# Patient Record
Sex: Male | Born: 1965 | Race: White | Hispanic: No | Marital: Married | Smoking: Never smoker
Health system: Southern US, Community
[De-identification: ages and names within clinical notes are randomized; demographics above are authoritative.]

## PROBLEM LIST (undated history)

## (undated) HISTORY — PX: ROTATOR CUFF REPAIR: SHX139

---

## 2007-02-24 ENCOUNTER — Emergency Department: Payer: Self-pay | Admitting: Emergency Medicine

## 2008-08-04 ENCOUNTER — Ambulatory Visit (HOSPITAL_BASED_OUTPATIENT_CLINIC_OR_DEPARTMENT_OTHER): Admission: RE | Admit: 2008-08-04 | Discharge: 2008-08-04 | Payer: Self-pay | Admitting: Orthopedic Surgery

## 2008-12-09 ENCOUNTER — Ambulatory Visit (HOSPITAL_BASED_OUTPATIENT_CLINIC_OR_DEPARTMENT_OTHER): Admission: RE | Admit: 2008-12-09 | Discharge: 2008-12-09 | Payer: Self-pay | Admitting: Orthopedic Surgery

## 2010-05-22 LAB — POCT HEMOGLOBIN-HEMACUE: Hemoglobin: 14.7 g/dL (ref 13.0–17.0)

## 2010-06-27 NOTE — Op Note (Signed)
NAME:  LAW, CORSINO NO.:  [id number]   MEDICAL RECORD NO.:  [id number]          PATIENT TYPE:  AMB   LOCATION:  DSC                          FACILITY:  [facility]   PHYSICIAN:  Loreta Ave, M.D. DATE OF BIRTH:  26-Jun-1965   DATE OF PROCEDURE:  08/04/2008  DATE OF DISCHARGE:                               OPERATIVE REPORT   PREOPERATIVE DIAGNOSIS:  Right shoulder impingement.  Labral tear.  Spinoglenoid cyst.   POSTOPERATIVE DIAGNOSIS:  Right shoulder impingement.  Labral tear.  Spinoglenoid cyst.   PROCEDURE:  Right shoulder exam under anesthesia, arthroscopy,  debridement of labrum.  Arthroscopic excision of the spinoglenoid cyst.  Bursectomy, acromioplasty, rotator cuff debridement.  Coracoacromial  ligament release.  Excision of distal clavicle.   SURGEON:  Loreta Ave, MD   ASSISTANT:  Eulas Post, MD and Genene Churn. Barry Dienes, [place]   ANESTHESIA:  General.   BLOOD LOSS:  Minimal.   SPECIMENS:  None.   CULTURES:  None.   COMPLICATIONS:  None.   DRESSING:  Soft compressive with sling.   PROCEDURE:  The patient was brought to the operating room and placed on  the operating table in supine position.  After adequate anesthesia had  been obtained, shoulder examined.  Full motion, stable shoulder.  Placed  in a beach-chair position on the shoulder positioner and prepped and  draped in the usual sterile fashion.  Three initial portals, anterior,  posterior, and lateral.  Shoulder entered with blunt obturator.  Arthroscope introduced into the shoulder distended and inspected.  Tearing of anterior and posterior labrum debrided.  No significant  paralabral cyst in the front or back.  Biceps tendon and anchor intact,  a little hypermobility, but not a slap lesion.  Articular cartilage,  capsular ligamentous structures, undersurface of rotator cuff excellent.  I then used blunt dissection above the glenoid into the spinoglenoid  notch.  A fourth portal  was added superiorly for debridement.  The cyst  was accessed evident and excised in its entirety with a nonaggressive  shaver.  Taken down to the level of the suprascapular nerve below this.  We just rubbing a blunt instrument over the nerve usually at the muscle  to fiber.  It was obviously protected throughout.  Once I had good  confirmation of excision of the cyst, cannula redirected subacromially.  Type 3 acromion, reactive bursitis, a lot of abrasion on top of the  cuff, no full-thickness tears.  Bursa resected.  Cuff debrided.  Acromioplasty to a type 1 acromion with shaver and high-speed bur,  releasing CA ligament with cautery.  Distal clavicle exposed.  Grade 3  changes of spurs.  Periarticular spurs and lateral centimeter of  clavicle resected.  Adequacy of decompression confirmed viewing from all portals.  Instruments and fluid removed.  Portals closed with nylon.  Sterile  compressive dressing applied.  Sling applied.  Anesthesia reversed.  Brought to the recovery room.  Tolerated surgery well.  No  complications.      Loreta Ave, M.D.  Electronically Signed     DFM/MEDQ  D:  08/04/2008  T:  08/05/2008  Job:  [id number]

## 2017-06-08 ENCOUNTER — Other Ambulatory Visit: Payer: Self-pay | Admitting: Hematology & Oncology

## 2017-06-08 MED ORDER — DOXYCYCLINE HYCLATE 100 MG PO TABS: 100.0000 mg | ORAL_TABLET | Freq: Two times a day (BID) | ORAL | 0 refills | Status: DC

## 2019-04-07 ENCOUNTER — Encounter (HOSPITAL_COMMUNITY): Payer: Self-pay

## 2019-04-07 ENCOUNTER — Emergency Department (HOSPITAL_COMMUNITY): Payer: 59

## 2019-04-07 ENCOUNTER — Emergency Department (HOSPITAL_COMMUNITY)
Admission: EM | Admit: 2019-04-07 | Discharge: 2019-04-08 | Disposition: A | Discharge status: Home / Self Care | Payer: 59 | Attending: Emergency Medicine | Admitting: Emergency Medicine

## 2019-04-07 ENCOUNTER — Other Ambulatory Visit: Payer: Self-pay

## 2019-04-07 DIAGNOSIS — W231XXA Caught, crushed, jammed, or pinched between stationary objects, initial encounter: Secondary | ICD-10-CM | POA: Diagnosis not present

## 2019-04-07 DIAGNOSIS — S67194A Crushing injury of right ring finger, initial encounter: Secondary | ICD-10-CM | POA: Insufficient documentation

## 2019-04-07 DIAGNOSIS — Z23 Encounter for immunization: Secondary | ICD-10-CM | POA: Insufficient documentation

## 2019-04-07 DIAGNOSIS — S61214A Laceration without foreign body of right ring finger without damage to nail, initial encounter: Secondary | ICD-10-CM | POA: Insufficient documentation

## 2019-04-07 DIAGNOSIS — R030 Elevated blood-pressure reading, without diagnosis of hypertension: Secondary | ICD-10-CM | POA: Insufficient documentation

## 2019-04-07 DIAGNOSIS — S67190A Crushing injury of right index finger, initial encounter: Secondary | ICD-10-CM | POA: Insufficient documentation

## 2019-04-07 DIAGNOSIS — Y999 Unspecified external cause status: Secondary | ICD-10-CM | POA: Insufficient documentation

## 2019-04-07 DIAGNOSIS — Y9389 Activity, other specified: Secondary | ICD-10-CM | POA: Insufficient documentation

## 2019-04-07 DIAGNOSIS — Y92321 Football field as the place of occurrence of the external cause: Secondary | ICD-10-CM | POA: Insufficient documentation

## 2019-04-07 DIAGNOSIS — I10 Essential (primary) hypertension: Secondary | ICD-10-CM

## 2019-04-07 DIAGNOSIS — S61210A Laceration without foreign body of right index finger without damage to nail, initial encounter: Secondary | ICD-10-CM | POA: Diagnosis not present

## 2019-04-07 DIAGNOSIS — S6710XA Crushing injury of unspecified finger(s), initial encounter: Secondary | ICD-10-CM

## 2019-04-07 MED ORDER — LIDOCAINE HCL 2 % IJ SOLN
10.0000 mL | Freq: Once | INTRAMUSCULAR | Status: AC
  Administered 2019-04-07: 22:00:00 200 mg via INTRADERMAL
  Filled 2019-04-07: qty 20

## 2019-04-07 MED ORDER — TETANUS-DIPHTH-ACELL PERTUSSIS 5-2.5-18.5 LF-MCG/0.5 IM SUSP
0.5000 mL | Freq: Once | INTRAMUSCULAR | Status: AC
  Administered 2019-04-07: 22:00:00 0.5 mL via INTRAMUSCULAR
  Filled 2019-04-07: qty 0.5

## 2019-04-07 NOTE — ED Triage Notes (Signed)
Pt has a laceration to his right index finger and his middle finger from football practice

## 2019-04-07 NOTE — ED Provider Notes (Signed)
[facility]EMERGENC[facility]PT Provider Note   CSN: [id number] Arrival date & time: 04/07/19  2057     History No chief complaint on file.   Douglas Pena is a 54 y.o. male presents for evaluation of acute onset, persistent laceration to the palmar aspect of the right second digit.  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  He reports that the football players were pushing a seven man sled when it got stuck against a metal fence.  He reports that he was attempting to move the sled against the fence when one of the players accidentally crushed his right hand between the fence and the sled resulting in a laceration.  He denies significant pain around the laceration but does note some pain along the adjacent digit.  Denies numbness or weakness of the extremity.  He is not on any blood thinners.  He reports that it has been several years since he has been evaluated by a PCP.  He is right-hand dominant.  His tetanus is not up-to-date.  The history is provided by the patient.       History reviewed. No pertinent past medical history.  There are no problems to display for this patient.   History reviewed. No pertinent surgical history.     History reviewed. No pertinent family history.  Social History   Tobacco Use  . Smoking status: Never Smoker  . Smokeless tobacco: Never Used  Substance Use Topics  . Alcohol use: Never  . Drug use: Never    Home Medications Prior to Admission medications   Medication Sig Start Date End Date Taking? Authorizing Provider  cephALEXin (KEFLEX) 500 MG capsule Take 1 capsule (500 mg total) by mouth 2 (two) times daily for 5 days. 04/08/19 04/13/19  Michela Pitcher A, PA-C  doxycycline (VIBRA-TABS) 100 MG tablet Take 1 tablet (100 mg total) by mouth 2 (two) times daily. 06/08/17   Josph Macho, MD    Allergies    Patient has no allergy information on record.  Review of Systems   Review of  Systems  Musculoskeletal: Positive for arthralgias.  Skin: Positive for wound.  Neurological: Negative for numbness.  Hematological: Does not bruise/bleed easily.    Physical Exam Updated Vital Signs BP (!) 159/95   Pulse 60   Temp 98.3 F (36.8 C) (Oral)   Resp 18   SpO2 100%   Physical Exam Vitals and nursing note reviewed.  Constitutional:      General: He is not in acute distress.    Appearance: He is well-developed.  HENT:     Head: Normocephalic and atraumatic.  Eyes:     General:        Right eye: No discharge.        Left eye: No discharge.     Conjunctiva/sclera: Conjunctivae normal.  Neck:     Vascular: No JVD.     Trachea: No tracheal deviation.  Cardiovascular:     Rate and Rhythm: Normal rate.     Pulses: Normal pulses.     Comments: 2+ radial pulses bilaterally Pulmonary:     Effort: Pulmonary effort is normal.  Abdominal:     General: There is no distension.  Musculoskeletal:        General: Swelling and tenderness present.     Comments: 4.5 cm semilunar laceration to the palmar aspect of the right second digit, bleeding controlled.  1 cm laceration overlying the right  third PIP joint.  Some swelling noted along the proximal phalanx of the right third digit but no erythema.  He is able to make a fist without difficulty.  5/5 strength of right wrist and digits with flexion and extension against resistance.  Equal grip strength bilaterally.  Skin:    General: Skin is warm and dry.     Findings: No erythema.  Neurological:     Mental Status: He is alert.     Comments: Sensation intact to light touch of bilateral hands.  Psychiatric:        Behavior: Behavior normal.     ED Results / Procedures / Treatments   Labs (all labs ordered are listed, but only abnormal results are displayed) Labs Reviewed - No data to display  EKG None  Radiology DG Hand Complete Right  Result Date: 04/07/2019 CLINICAL DATA:  Laceration, crushing injury, palmar  surface of the base of the index finger, EXAM: RIGHT HAND - COMPLETE 3+ VIEW COMPARISON:  None. FINDINGS: Soft tissue gas noted along the palmar aspect at the base of the second digit at the level of the proximal phalangeal diaphysis. No radiopaque foreign body within the soft tissues. No subjacent osseous injury. No fracture or traumatic malalignment at this location or elsewhere within the hand. Mild arthrosis of the first carpometacarpal and triscaphe joints. No suspicious osseous lesions. IMPRESSION: Soft tissue gas noted along the palmar aspect at the base of the second digit compatible with reported site of laceration. No radiopaque foreign body or subjacent osseous injury. Electronically Signed   By: Lovena Le M.D.   On: 04/07/2019 22:19     Procedures Procedures (including critical care time)  Medications Ordered in ED Medications  Tdap (BOOSTRIX) injection 0.5 mL (0.5 mLs Intramuscular Given 04/07/19 2218)  lidocaine (XYLOCAINE) 2 % (with pres) injection 200 mg (200 mg Intradermal Given by Other 04/07/19 2221)  cephALEXin (KEFLEX) capsule 500 mg (500 mg Oral Given 04/08/19 0020)    ED Course  I have reviewed the triage vital signs and the nursing notes.  Pertinent labs & imaging results that were available during my care of the patient were reviewed by me and considered in my medical decision making (see chart for details).    MDM Rules/Calculators/A&P                      Patient presenting for evaluation after hand injury.  He is afebrile, hypertensive but improved on reevaluation.  Could be secondary to pain.  He has not been seen or evaluated by a PCP in several years so I instructed him to continue monitoring his blood pressures at home and follow-up with a PCP to establish care and for reevaluation.  He is neurovascularly intact, bleeding is controlled.  His tetanus was updated today.  Radiographs show no underlying fracture or retained foreign bodies. Pressure irrigation  performed. Wound explored and base of wound visualized in a bloodless field without evidence of foreign body.  Laceration occurred < 8 hours prior to repair which was well tolerated.  He was discharged with a course of Keflex due to the nature of his injury.  Discussed suture home care with patient and answered questions. Patient to follow-up for wound check and suture removal in 7 days; he is to return to the ED sooner for signs of infection. Patient verbalized understanding of and agreement with plan and is safe for discharge home at this time.   Final Clinical Impression(s) / ED Diagnoses  Final diagnoses:  Crushing injury of finger of right hand  Laceration of right index finger without foreign body without damage to nail, initial encounter  Hypertension, unspecified type    Rx / DC Orders ED Discharge Orders         Ordered    cephALEXin (KEFLEX) 500 MG capsule  2 times daily     04/08/19 0015           Jeanie Sewer, PA-C 04/10/19 1107    Linwood Dibbles, MD 04/11/19 0830

## 2019-04-07 NOTE — ED Notes (Signed)
Pt went to XR

## 2019-04-08 MED ORDER — CEPHALEXIN 500 MG PO CAPS: 500.0000 mg | ORAL_CAPSULE | Freq: Two times a day (BID) | ORAL | 0 refills | Status: AC

## 2019-04-08 MED ORDER — CEPHALEXIN 500 MG PO CAPS
500.0000 mg | ORAL_CAPSULE | Freq: Once | ORAL | Status: AC
  Administered 2019-04-08: 500 mg via ORAL
  Filled 2019-04-08: qty 1

## 2019-04-08 NOTE — Discharge Instructions (Signed)
1. Medications: Please take all of your antibiotics until finished!   Take your antibiotics with food.  Common side effects of antibiotics include nausea, vomiting, abdominal discomfort, and diarrhea. You may help offset some of this with probiotics which you can buy or get in yogurt. Do not eat  or take the probiotics until 2 hours after your antibiotic.    Alternate 600 mg of ibuprofen and [phone] mg of Tylenol every 3-6 hours as needed for pain. Do not exceed 4000 mg of Tylenol daily.  Take ibuprofen with food to avoid upset stomach issues. 2. Treatment: ice for swelling, keep wound clean with warm soap and water and keep bandage dry, do not submerge in water for 24 hours 3. Follow Up: Please return in 7 days to have your stitches/staples removed or sooner if you have concerns.  You may also go to urgent care or your primary care physician.  Return to the emergency department sooner if any concerning signs or symptoms develop such as high fevers, redness, drainage of pus from the wound, or swelling.  If your blood pressure (BP) was elevated on multiple readings during this visit above 130 for the top number or above 80 for the bottom number, please have this repeated by your primary care provider within one month. You can also check your blood pressure when you are out at a pharmacy or grocery store. Many have machines that will check your blood pressure.  If your blood pressure remains elevated, please follow-up with your PCP.   WOUND CARE  Keep area clean and dry for 24 hours. Do not remove bandage, if applied.  After 24 hours, remove bandage and wash wound gently with mild soap and warm water. Reapply a new bandage after cleaning wound, if directed.   Continue daily cleansing with soap and water until stitches/staples are removed.  Do not apply any ointments or creams to the wound while stitches/staples are in place, as this may cause delayed healing. Return if you experience any of the  following signs of infection: Swelling, redness, pus drainage, streaking, fever >101.0 F  Return if you experience excessive bleeding that does not stop after 15-20 minutes of constant, firm pressure.

## 2019-04-15 ENCOUNTER — Encounter: Payer: Self-pay | Admitting: Nurse Practitioner

## 2019-04-15 ENCOUNTER — Other Ambulatory Visit: Payer: Self-pay

## 2019-04-15 ENCOUNTER — Ambulatory Visit (INDEPENDENT_AMBULATORY_CARE_PROVIDER_SITE_OTHER): Payer: 59 | Admitting: Nurse Practitioner

## 2019-04-15 ENCOUNTER — Other Ambulatory Visit: Payer: Self-pay | Admitting: Nurse Practitioner

## 2019-04-15 VITALS — BP 120/80 | HR 63 | Temp 98.0°F | Resp 18 | Ht 72.0 in | Wt 283.4 lb

## 2019-04-15 DIAGNOSIS — Z13228 Encounter for screening for other metabolic disorders: Secondary | ICD-10-CM | POA: Diagnosis not present

## 2019-04-15 DIAGNOSIS — Z125 Encounter for screening for malignant neoplasm of prostate: Secondary | ICD-10-CM

## 2019-04-15 DIAGNOSIS — Z7689 Persons encountering health services in other specified circumstances: Secondary | ICD-10-CM

## 2019-04-15 DIAGNOSIS — Z4802 Encounter for removal of sutures: Secondary | ICD-10-CM | POA: Diagnosis not present

## 2019-04-15 DIAGNOSIS — Z13220 Encounter for screening for lipoid disorders: Secondary | ICD-10-CM

## 2019-04-15 NOTE — Progress Notes (Signed)
New Patient Office Visit  Subjective:  Patient ID: Douglas [facility]ena, male    DOB: September 14, 1965  Age: 54 y.o. MRN: [id number]  CC:  Chief Complaint  Patient presents with  . Establish Care  . Wound Check    suture removal    HPI Douglas Pena is a 54 year old male presenting to the clinic to establish care. Medical history and medications discussed and reviewed. No cp/ct, gu/gisxs, pain, edema, sob, no recent falls. He does need sutures removed. 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 Pt reports left eye bruised [place] and was knocked over.   History reviewed. No pertinent past medical history.  No past surgical history on file.  No family history on file.  Social History   Socioeconomic History  . Marital status: Married    Spouse name: Not on file  . Number of children: Not on file  . Years of education: Not on file  . Highest education level: Not on file  Occupational History  . Not on file  Tobacco Use  . Smoking status: Never Smoker  . Smokeless tobacco: Never Used  Substance and Sexual Activity  . Alcohol use: Never  . Drug use: Never  . Sexual activity: Not on file  Other Topics Concern  . Not on file  Social History Narrative  . Not on file   Social Determinants of Health   Financial Resource Strain:   . Difficulty of Paying Living Expenses: Not on file  Food Insecurity:   . Worried About Programme researcher, broadcasting/film/video in the Last Year: Not on file  . Ran Out of Food in the Last Year: Not on file  Transportation Needs:   . Lack of Transportation (Medical): Not on file  . Lack of Transportation (Non-Medical): Not on file  Physical Activity:   . Days of Exercise per Week: Not on file  . Minutes of Exercise per Session: Not on file  Stress:   . Feeling of Stress : Not on file  Social Connections:   . Frequency of Communication with Friends and Family: Not on file  . Frequency  of Social Gatherings with Friends and Family: Not on file  . Attends Religious Services: Not on file  . Active Member of Clubs or Organizations: Not on file  . Attends Banker Meetings: Not on file  . Marital Status: Not on file  Intimate Partner Violence:   . Fear of Current or Ex-Partner: Not on file  . Emotionally Abused: Not on file  . Physically Abused: Not on file  . Sexually Abused: Not on file    ROS Review of Systems  All other systems reviewed and are negative.   Objective:   Today's Vitals: BP 120/80 (BP Location: Left Arm, Patient Position: Sitting, Cuff Size: Large)   Pulse 63   Temp 98 F (36.7 C) (Oral)   Resp 18   Ht 6' (1.829 m)   Wt 283 lb 6.4 oz (128.5 kg)   SpO2 97%   BMI 38.44 kg/m   Physical Exam Vitals and nursing note reviewed.  Constitutional:      Appearance: Normal appearance. He is not ill-appearing.  HENT:     Head: Normocephalic.     Right Ear: Tympanic membrane, ear canal and external ear normal.     Left Ear: Tympanic membrane, ear canal and external ear normal.  Nose: Nose normal.     Mouth/Throat:     Lips: Pink.     Mouth: Mucous membranes are moist.     Pharynx: Oropharynx is clear.  Eyes:     General: Lids are normal. Lids are everted, no foreign bodies appreciated.     Extraocular Movements: Extraocular movements intact.     Right eye: No nystagmus.     Left eye: No nystagmus.     Conjunctiva/sclera: Conjunctivae normal.     Pupils: Pupils are equal, round, and reactive to light.  Neck:     Thyroid: No thyromegaly.  Cardiovascular:     Rate and Rhythm: Normal rate and regular rhythm.     Pulses: Normal pulses.     Heart sounds: Normal heart sounds, S1 normal and S2 normal.  Pulmonary:     Effort: Pulmonary effort is normal.     Breath sounds: Normal breath sounds.  Musculoskeletal:        General: Normal range of motion.     Cervical back: Normal range of motion and neck supple.     Right lower leg: No  edema.     Left lower leg: No edema.  Lymphadenopathy:     Cervical: No cervical adenopathy.  Skin:    General: Skin is warm.     Capillary Refill: Capillary refill takes less than 2 seconds.     Findings: Wound present. No bruising, erythema or laceration.     Nails: There is no clubbing.          Comments: Suture closed healing old laceration to the palmar aspect of the right second digit, sutures removed, well approximated closed edges. No drainage, no foul odor, no eryhtme or edema.normal temperature.  Healing colored bruising to left eye see HPI  Neurological:     General: No focal deficit present.     Mental Status: He is alert and oriented to person, place, and time.  Psychiatric:        Attention and Perception: Attention normal.        Mood and Affect: Mood normal.        Speech: Speech normal.        Behavior: Behavior normal. Behavior is cooperative.        Thought Content: Thought content normal.        Cognition and Memory: Cognition normal.        Judgment: Judgment normal.     Assessment & Plan:  Established care today with clinic, labs drawn and will call you with abnormal results You should repeat labs in 6 m with apt one week after for annual physical Suture Removal: pt tolerated well, print out provided follow directions  Problem List Items Addressed This Visit    None    Visit Diagnoses    Encounter to establish care    -  Primary   Relevant Orders   COMPLETE METABOLIC PANEL WITH GFR   CBC with Differential/Platelet   Lipid panel   PSA   Visit for suture removal       Lipid screening       Relevant Orders   Lipid panel   Screening for metabolic disorder       Relevant Orders   COMPLETE METABOLIC PANEL WITH GFR   CBC with Differential/Platelet   Screening PSA (prostate specific antigen)       Relevant Orders   PSA      Outpatient Encounter Medications as of 04/15/2019  Medication Sig  . [  DISCONTINUED] doxycycline (VIBRA-TABS) 100 MG tablet  Take 1 tablet (100 mg total) by mouth 2 (two) times daily.   No facility-administered encounter medications on file as of 04/15/2019.    Follow-up:  PRN for signs/symptoms of infection to laceration site; f/u in 6 m for CPE with one wk prior labs.  Elmore Guise, FNP

## 2019-04-16 ENCOUNTER — Other Ambulatory Visit: Payer: Self-pay | Admitting: Nurse Practitioner

## 2019-04-16 DIAGNOSIS — R7309 Other abnormal glucose: Secondary | ICD-10-CM

## 2019-04-16 DIAGNOSIS — E782 Mixed hyperlipidemia: Secondary | ICD-10-CM

## 2019-04-16 NOTE — Progress Notes (Signed)
Patients cholesterol levels are elevated. He should follow low fat and cholesterol diet, exercise at least 20 minutes 4 times a week. Recheck in 6 months. He may take OTC fish oil or omega 3.   Glucose was elevated and I will ask him to come in asap for A1C lab draw (during office hours).

## 2019-04-18 LAB — TEST AUTHORIZATION

## 2019-04-18 LAB — LIPID PANEL
Cholesterol: 213 mg/dL — ABNORMAL HIGH (ref <200)
HDL: 48 mg/dL (ref >=40)
LDL Cholesterol (Calc): 124 mg/dL (calc) — ABNORMAL HIGH
Non-HDL Cholesterol (Calc): 165 mg/dL (calc) — ABNORMAL HIGH (ref <130)
Total CHOL/HDL Ratio: 4.4 (calc) (ref <5.0)
Triglycerides: 266 mg/dL — ABNORMAL HIGH (ref <150)

## 2019-04-18 LAB — COMPLETE METABOLIC PANEL WITH GFR
AG Ratio: 1.8 (calc) (ref 1.0–2.5)
ALT: 21 U/L (ref 9–46)
AST: 21 U/L (ref 10–35)
Albumin: 4.4 g/dL (ref 3.6–5.1)
Alkaline phosphatase (APISO): 67 U/L (ref 35–144)
BUN: 18 mg/dL (ref 7–25)
CO2: 25 mmol/L (ref 20–32)
Calcium: 9.2 mg/dL (ref 8.6–10.3)
Chloride: 103 mmol/L (ref 98–110)
Creat: 0.94 mg/dL (ref 0.70–1.33)
GFR, Est African American: 108 mL/min/{1.73_m2} (ref >=60)
GFR, Est Non African American: 93 mL/min/{1.73_m2} (ref >=60)
Globulin: 2.5 g/dL (calc) (ref 1.9–3.7)
Glucose, Bld: 116 mg/dL — ABNORMAL HIGH (ref 65–99)
Potassium: 4.4 mmol/L (ref 3.5–5.3)
Sodium: 139 mmol/L (ref 135–146)
Total Bilirubin: 0.5 mg/dL (ref 0.2–1.2)
Total Protein: 6.9 g/dL (ref 6.1–8.1)

## 2019-04-18 LAB — PSA: PSA: 0.8 ng/mL (ref <=4.0)

## 2019-04-18 LAB — CBC WITH DIFFERENTIAL/PLATELET
Absolute Monocytes: 451 cells/uL (ref 200–950)
Basophils Absolute: 30 cells/uL (ref 0–200)
Basophils Relative: 0.4 %
Eosinophils Absolute: 163 cells/uL (ref 15–500)
Eosinophils Relative: 2.2 %
HCT: 40.4 % (ref 38.5–50.0)
Hemoglobin: 14.2 g/dL (ref 13.2–17.1)
Lymphs Abs: 1584 cells/uL (ref 850–3900)
MCH: 31.3 pg (ref 27.0–33.0)
MCHC: 35.1 g/dL (ref 32.0–36.0)
MCV: 89 fL (ref 80.0–100.0)
MPV: 11.1 fL (ref 7.5–12.5)
Monocytes Relative: 6.1 %
Neutro Abs: 5173 cells/uL (ref 1500–7800)
Neutrophils Relative %: 69.9 %
Platelets: 261 10*3/uL (ref 140–400)
RBC: 4.54 10*6/uL (ref 4.20–5.80)
RDW: 13.3 % (ref 11.0–15.0)
Total Lymphocyte: 21.4 %
WBC: 7.4 10*3/uL (ref 3.8–10.8)

## 2019-04-18 LAB — HEMOGLOBIN A1C W/OUT EAG: Hgb A1c MFr Bld: 6 % of total Hgb — ABNORMAL HIGH (ref <5.7)

## 2019-06-22 ENCOUNTER — Ambulatory Visit: Payer: 59 | Admitting: Primary Care

## 2019-10-15 ENCOUNTER — Encounter: Payer: 59 | Admitting: Nurse Practitioner

## 2020-02-09 ENCOUNTER — Ambulatory Visit: Payer: BC Managed Care – PPO | Admitting: Psychology

## 2020-08-17 IMAGING — CR DG HAND COMPLETE 3+V*R*
3 series · 3 of 3 positions shown · non-contrast
Comparison: None.

CLINICAL DATA: Laceration, crushing injury, palmar surface of the
base of the index finger,

EXAM:
RIGHT HAND - COMPLETE 3+ VIEW

[x hand pa right]
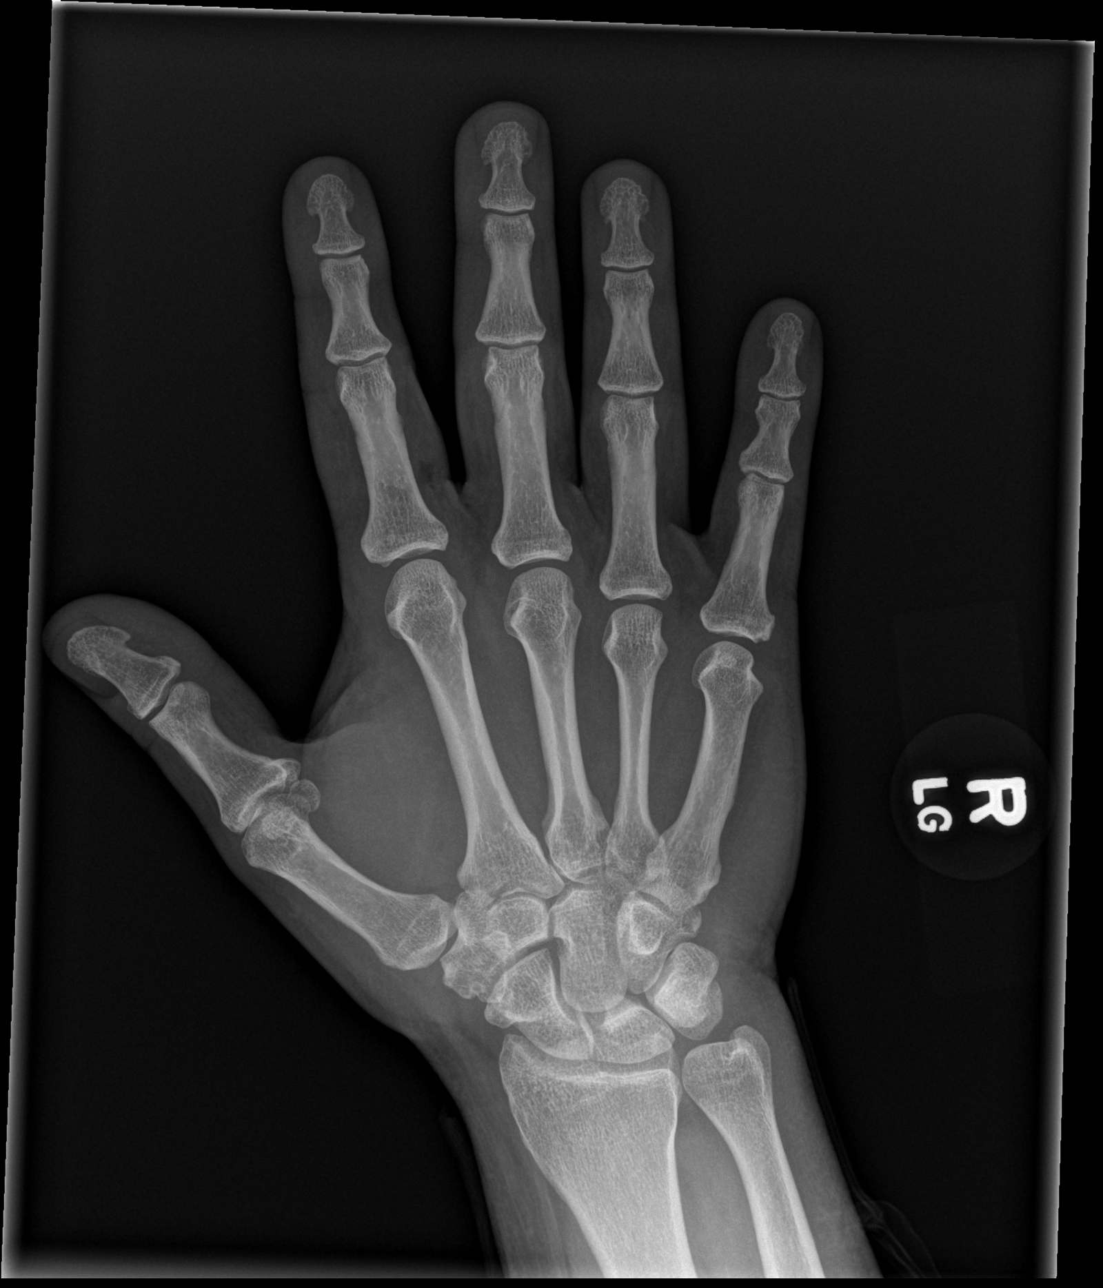

[x hand obl right]
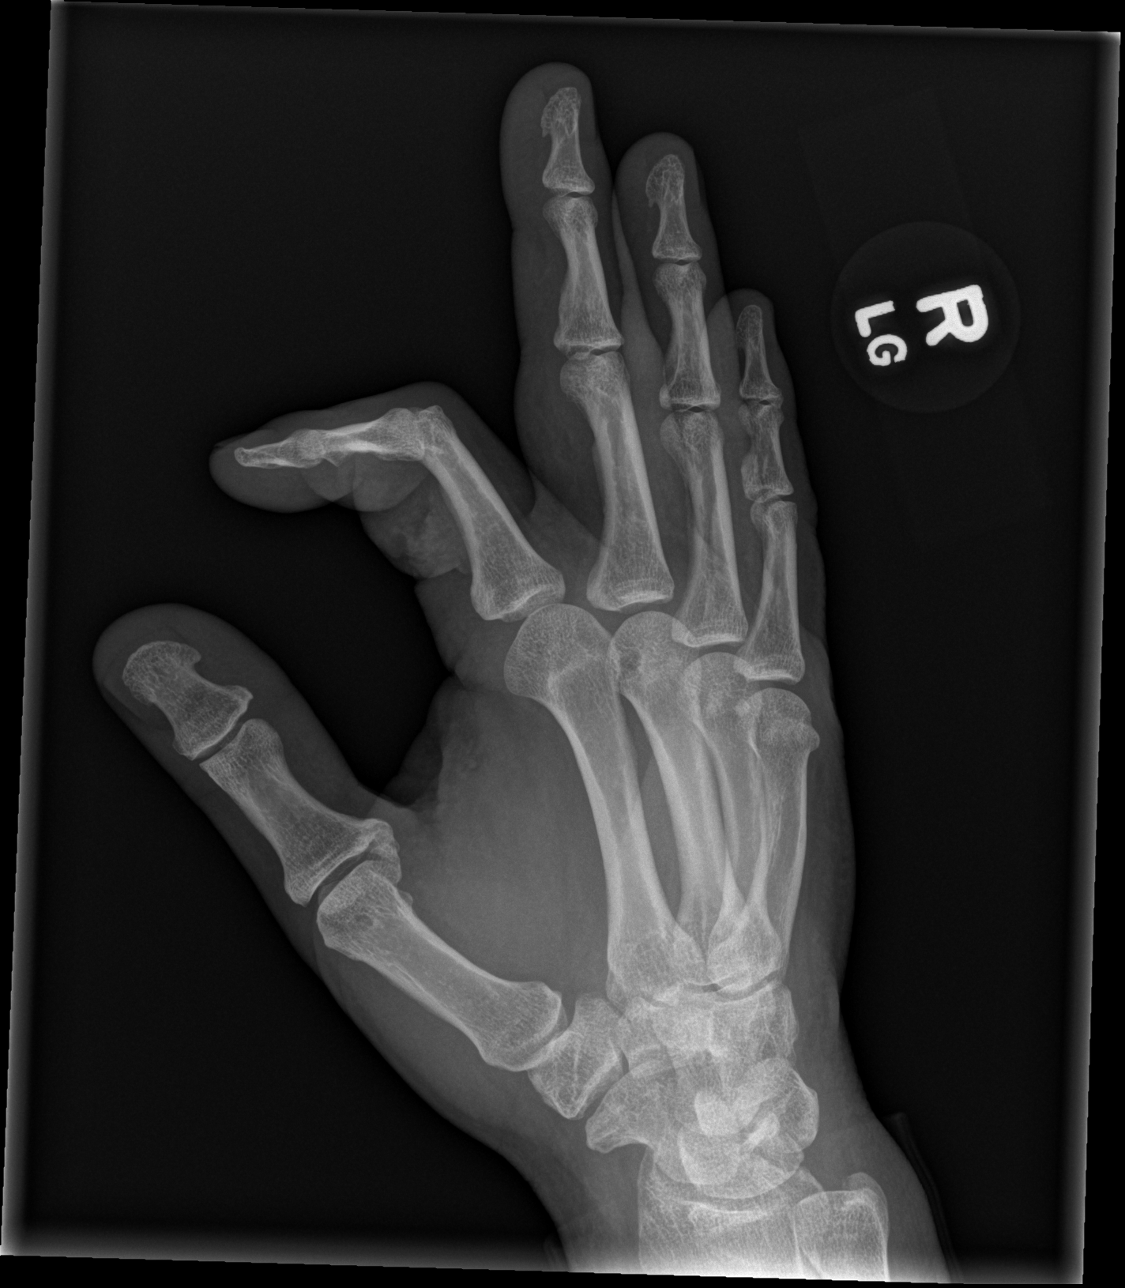

[x hand lat right]
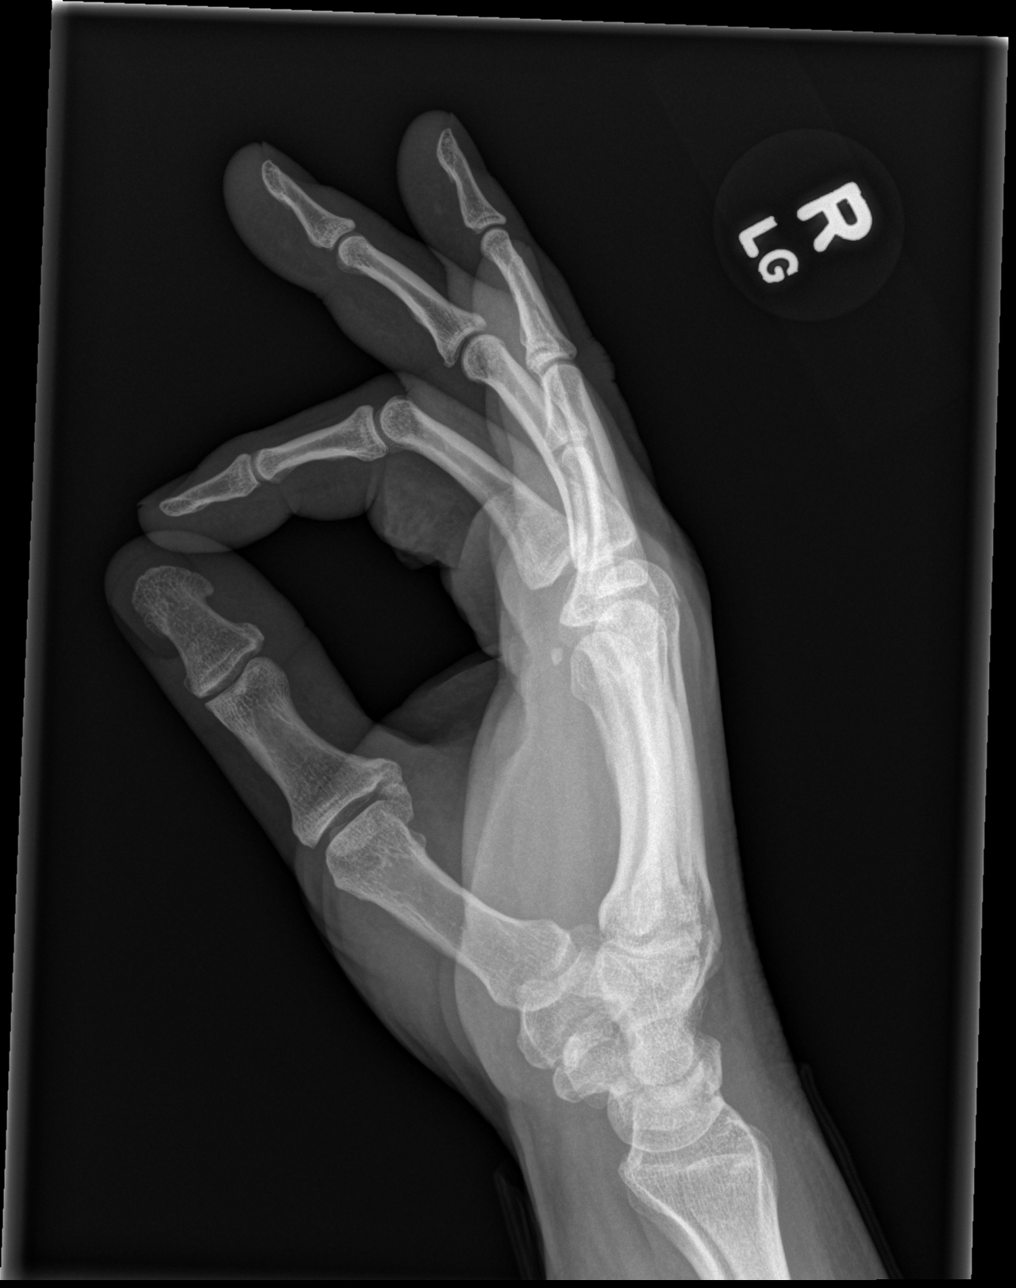

[3 of 3 positions shown; findings below may reference images not displayed]

FINDINGS: Soft tissue gas noted along the palmar aspect at the base of the
second digit at the level of the proximal phalangeal diaphysis. No
radiopaque foreign body within the soft tissues. No subjacent
osseous injury. No fracture or traumatic malalignment at this
location or elsewhere within the hand. Mild arthrosis of the first
carpometacarpal and triscaphe joints. No suspicious osseous lesions.
IMPRESSION: Soft tissue gas noted along the palmar aspect at the base of the
second digit compatible with reported site of laceration.

No radiopaque foreign body or subjacent osseous injury.

## 2021-09-08 ENCOUNTER — Ambulatory Visit
Admission: RE | Admit: 2021-09-08 | Discharge: 2021-09-08 | Disposition: A | Discharge status: Home / Self Care | Payer: Managed Care, Other (non HMO) | Source: Ambulatory Visit

## 2021-09-08 ENCOUNTER — Telehealth: Payer: Self-pay | Admitting: Family Medicine

## 2021-09-08 VITALS — BP 152/90 | HR 66 | Temp 98.1°F | Resp 16

## 2021-09-08 DIAGNOSIS — R030 Elevated blood-pressure reading, without diagnosis of hypertension: Secondary | ICD-10-CM | POA: Diagnosis not present

## 2021-09-08 NOTE — ED Triage Notes (Signed)
Patient c/o Hypertension x 4 days.   Patient denies chest pain, dizziness, headache, SOB, or palpations.   Patient endorses " I was going to work for [employer] and the med tech told me, my systolic pressure was too high".   Patient has never been diagnosed with hypertension.

## 2021-09-08 NOTE — Discharge Instructions (Addendum)
Your blood pressure is elevated today at 160/90; repeat 152/90.  Please have this rechecked by your new primary care provider next week.

## 2021-09-08 NOTE — Telephone Encounter (Signed)
Agree. Thanks

## 2021-09-08 NOTE — ED Provider Notes (Signed)
[facility]    CSN: [id number] Arrival date & time: 09/08/21  1343      History   Chief Complaint Chief Complaint  Patient presents with   Hypertension    HPI Douglas Pena is a 56 y.o. male.  Patient presents with elevated blood pressure during a medical check at work.  He denies dizziness, weakness, numbness, chest pain, shortness of breath, lower extremity edema, or other symptoms.  He does not have a PCP currently.  His medical history includes hyperlipidemia..   The history is provided by the patient.    History reviewed. No pertinent past medical history.  There are no problems to display for this patient.   Past Surgical History:  Procedure Laterality Date   ROTATOR CUFF REPAIR Bilateral        Home Medications    Prior to Admission medications   Not on File    Family History History reviewed. No pertinent family history.  Social History Social History   Tobacco Use   Smoking status: Never   Smokeless tobacco: Never  Vaping Use   Vaping Use: Never used  Substance Use Topics   Alcohol use: Yes    Comment: "occasionally"   Drug use: Never     Allergies   Patient has no known allergies.   Review of Systems Review of Systems   Physical Exam Triage Vital Signs ED Triage Vitals  Enc Vitals Group     BP      Pulse      Resp      Temp      Temp src      SpO2      Weight      Height      Head Circumference      Peak Flow      Pain Score      Pain Loc      Pain Edu?      Excl. in GC?    No data found.  Updated Vital Signs BP (!) 152/90 (BP Location: Right Arm)   Pulse 66   Temp 98.1 F (36.7 C) (Oral)   Resp 16   SpO2 97%   Visual Acuity Right Eye Distance:   Left Eye Distance:   Bilateral Distance:    Right Eye Near:   Left Eye Near:    Bilateral Near:     Physical Exam   UC Treatments / Results  Labs (all labs ordered are listed, but only abnormal results are displayed) Labs Reviewed - No data  to display  EKG   Radiology No results found.  Procedures Procedures (including critical care time)  Medications Ordered in UC Medications - No data to display  Initial Impression / Assessment and Plan / UC Course  I have reviewed the triage vital signs and the nursing notes.  Pertinent labs & imaging results that were available during my care of the patient were reviewed by me and considered in my medical decision making (see chart for details).  Elevated blood pressure reading.  Blood pressure elevated today.  Discussed managing and preventing hypertension, including DASH diet.  Written education provided on preventing hypertension.  Patient does not currently have a PCP.  His cholesterol was elevated when last checked in 2021.  UC nurse assisted patient in scheduling an appointment to establish a PCP next week.  Patient agrees to plan of care.   Final Clinical Impressions(s) / UC Diagnoses   Final diagnoses:  Elevated blood pressure  reading     Discharge Instructions      Your blood pressure is elevated today at 160/90; repeat 152/90.  Please have this rechecked by your new primary care provider next week.          ED Prescriptions   None    PDMP not reviewed this encounter.   Mickie Bail, NP 09/08/21 [phone]

## 2021-09-08 NOTE — Telephone Encounter (Signed)
Patient called and stated that he went for a job and he had to do a medical examination. Patient found out that his blood pressure readings was 180/130. Advise him to go to urgent care or emergency room to get the blood pressure taking care of before he get a new patient appointment. FYI chief doctor Para [facility].

## 2021-09-13 ENCOUNTER — Encounter: Payer: Self-pay | Admitting: Family Medicine

## 2021-09-13 ENCOUNTER — Ambulatory Visit (INDEPENDENT_AMBULATORY_CARE_PROVIDER_SITE_OTHER): Payer: Managed Care, Other (non HMO) | Admitting: Family Medicine

## 2021-09-13 VITALS — BP 160/98 | HR 63 | Temp 97.9°F | Ht 72.0 in | Wt 282.4 lb

## 2021-09-13 DIAGNOSIS — I10 Essential (primary) hypertension: Secondary | ICD-10-CM

## 2021-09-13 DIAGNOSIS — R7303 Prediabetes: Secondary | ICD-10-CM | POA: Diagnosis not present

## 2021-09-13 DIAGNOSIS — Z23 Encounter for immunization: Secondary | ICD-10-CM | POA: Diagnosis not present

## 2021-09-13 DIAGNOSIS — Z1211 Encounter for screening for malignant neoplasm of colon: Secondary | ICD-10-CM | POA: Diagnosis not present

## 2021-09-13 LAB — TSH: TSH: 5.56 u[IU]/mL — ABNORMAL HIGH (ref 0.35–5.50)

## 2021-09-13 LAB — URINALYSIS, ROUTINE W REFLEX MICROSCOPIC
Bilirubin Urine: NEGATIVE
Hgb urine dipstick: NEGATIVE
Ketones, ur: NEGATIVE
Leukocytes,Ua: NEGATIVE
Nitrite: NEGATIVE
RBC / HPF: NONE SEEN (ref 0–?)
Specific Gravity, Urine: 1.01 (ref 1.000–1.030)
Total Protein, Urine: NEGATIVE
Urine Glucose: NEGATIVE
Urobilinogen, UA: 0.2 (ref 0.0–1.0)
WBC, UA: NONE SEEN (ref 0–?)
pH: 7 (ref 5.0–8.0)

## 2021-09-13 LAB — LIPID PANEL
Cholesterol: 216 mg/dL — ABNORMAL HIGH (ref 0–200)
HDL: 45.9 mg/dL (ref >39.00)
NonHDL: 169.66
Total CHOL/HDL Ratio: 5
Triglycerides: 209 mg/dL — ABNORMAL HIGH (ref 0.0–149.0)
VLDL: 41.8 mg/dL — ABNORMAL HIGH (ref 0.0–40.0)

## 2021-09-13 LAB — HEMOGLOBIN A1C: Hgb A1c MFr Bld: 6.3 % (ref 4.6–6.5)

## 2021-09-13 LAB — MICROALBUMIN / CREATININE URINE RATIO
Creatinine,U: 75.4 mg/dL
Microalb Creat Ratio: 0.9 mg/g (ref 0.0–30.0)
Microalb, Ur: <0.7 mg/dL (ref 0.0–1.9)

## 2021-09-13 LAB — COMPREHENSIVE METABOLIC PANEL
ALT: 28 U/L (ref 0–53)
AST: 22 U/L (ref 0–37)
Albumin: 4.8 g/dL (ref 3.5–5.2)
Alkaline Phosphatase: 64 U/L (ref 39–117)
BUN: 16 mg/dL (ref 6–23)
CO2: 26 mEq/L (ref 19–32)
Calcium: 9.9 mg/dL (ref 8.4–10.5)
Chloride: 102 mEq/L (ref 96–112)
Creatinine, Ser: 1.14 mg/dL (ref 0.40–1.50)
GFR: 73.02 mL/min (ref >60.00)
Glucose, Bld: 128 mg/dL — ABNORMAL HIGH (ref 70–99)
Potassium: 4.9 mEq/L (ref 3.5–5.1)
Sodium: 138 mEq/L (ref 135–145)
Total Bilirubin: 0.6 mg/dL (ref 0.2–1.2)
Total Protein: 7.8 g/dL (ref 6.0–8.3)

## 2021-09-13 LAB — CBC WITH DIFFERENTIAL/PLATELET
Basophils Absolute: 0 10*3/uL (ref 0.0–0.1)
Basophils Relative: 0.5 % (ref 0.0–3.0)
Eosinophils Absolute: 0.2 10*3/uL (ref 0.0–0.7)
Eosinophils Relative: 2.5 % (ref 0.0–5.0)
HCT: 44.3 % (ref 39.0–52.0)
Hemoglobin: 15 g/dL (ref 13.0–17.0)
Lymphocytes Relative: 29.6 % (ref 12.0–46.0)
Lymphs Abs: 1.8 10*3/uL (ref 0.7–4.0)
MCHC: 33.8 g/dL (ref 30.0–36.0)
MCV: 93.4 fl (ref 78.0–100.0)
Monocytes Absolute: 0.6 10*3/uL (ref 0.1–1.0)
Monocytes Relative: 9.7 % (ref 3.0–12.0)
Neutro Abs: 3.5 10*3/uL (ref 1.4–7.7)
Neutrophils Relative %: 57.7 % (ref 43.0–77.0)
Platelets: 240 10*3/uL (ref 150.0–400.0)
RBC: 4.74 Mil/uL (ref 4.22–5.81)
RDW: 13.5 % (ref 11.5–15.5)
WBC: 6.1 10*3/uL (ref 4.0–10.5)

## 2021-09-13 LAB — LDL CHOLESTEROL, DIRECT: Direct LDL: 138 mg/dL

## 2021-09-13 MED ORDER — AMLODIPINE BESYLATE 5 MG PO TABS: 5.0000 mg | ORAL_TABLET | Freq: Every day | ORAL | 1 refills | Status: DC

## 2021-09-13 NOTE — Progress Notes (Signed)
New Patient Office Visit  Subjective:  Patient ID: Douglas Pena, male    DOB: 1965-07-31  Age: 56 y.o. MRN: [id number]  CC:  Chief Complaint  Patient presents with   Establish Care    Need new pcp HTN Fasting     HPI Douglas Pena presents for new pt.    Elevated bp found at medical check at work for [employer].  Sent to UC on 09/08/21.  Asymptomatic.  Bp 160/90.  No FH HTN.   CAD-pgpa-50's pgma-79.  Exercises-wts 3d/wk and other days.  Got large cuff last wk 128/84 yesterday.  Stress last pm and 149/97.  Going to work on diet  History reviewed. No pertinent past medical history.  Past Surgical History:  Procedure Laterality Date   ROTATOR CUFF REPAIR Bilateral     Family History  Problem Relation Age of Onset   Depression Paternal Grandmother    Heart attack Paternal Grandmother    Heart attack Paternal Grandfather     Social History   Socioeconomic History   Marital status: Married    Spouse name: Not on file   Number of children: 2   Years of education: Not on file   Highest education level: Not on file  Occupational History   Not on file  Tobacco Use   Smoking status: Never   Smokeless tobacco: Never  Vaping Use   Vaping Use: Never used  Substance and Sexual Activity   Alcohol use: Yes    Alcohol/week: 4.0 standard drinks of alcohol    Types: 2 Glasses of wine, 2 Cans of beer per week    Comment: "occasionally"   Drug use: Never   Sexual activity: Yes  Other Topics Concern   Not on file  Social History Narrative   Real estate agent   Social Determinants of Health   Financial Resource Strain: Not on file  Food Insecurity: Not on file  Transportation Needs: Not on file  Physical Activity: Not on file  Stress: Not on file  Social Connections: Not on file  Intimate Partner Violence: Not on file    ROS  ROS: Gen: no fever, chills  Skin: no rash, itching ENT: no ear pain, ear drainage, nasal congestion, rhinorrhea, sinus pressure, sore  throat Eyes: no blurry vision, double vision Resp: no cough, wheeze,SOB CV: no CP, palpitations, LE edema,  GI: no heartburn, n/v/d/c, abd pain GU: no dysuria, urgency, frequency, hematuria. No ED MSK: no joint pain, myalgias, back pain Neuro: no dizziness, headache, weakness, vertigo Psych: no depression, anxiety, insomnia, SI   Objective:   Today's Vitals: BP (!) 160/98   Pulse 63   Temp 97.9 F (36.6 C) (Temporal)   Ht 6' (1.829 m)   Wt 282 lb 6 oz (128.1 kg)   SpO2 95%   BMI 38.30 kg/m   Physical Exam  Gen: WDWN NAD OWM HEENT: NCAT, conjunctiva not injected, sclera nonicteric TM WNL hearing aids NECK:  supple, no thyromegaly, no nodes, no carotid bruits CARDIAC: RRR, S1S2+, no murmur. DP 2+B LUNGS: CTAB. No wheezes ABDOMEN:  BS+, soft, NTND, No HSM, no masses EXT:  no edema MSK: no gross abnormalities.  NEURO: A&O x3.  CN II-XII intact.  PSYCH: normal mood. Good eye contact   Vitiligo all over  EKG-RSR III,V1.  NSR.  No ST changes-prob normal  Reviewed labs in chart-2021 A1C 6, trigs 266, LDL 124, HDL 48  Assessment & Plan:   Problem List Items Addressed This Visit  None Visit Diagnoses     Primary hypertension    -  Primary   Relevant Medications   amLODipine (NORVASC) 5 MG tablet   Other Relevant Orders   Comprehensive metabolic panel   Hemoglobin A1c   TSH   Lipid panel   CBC with Differential/Platelet   Microalbumin / creatinine urine ratio   Urinalysis, Routine w reflex microscopic   EKG 12-Lead   Prediabetes       Relevant Orders   Hemoglobin A1c   Screen for colon cancer       Relevant Orders   Cologuard      HTN-new dx.  Work on diet/exercise.  Check cbc,cmp,ua, urine microalb/creat, lipids, tsh, A1C   start amlodipine 5mg . EKG.  Monitor bp's.  F/u 1 mo.   Predm-pt unaware.  Work on diet/exercise.  Check A1C  Outpatient Encounter Medications as of 09/13/2021  Medication Sig   amLODipine (NORVASC) 5 MG tablet Take 1 tablet (5 mg total)  by mouth daily.   No facility-administered encounter medications on file as of 09/13/2021.    Follow-up: Return in about 4 weeks (around 10/11/2021) for htn.   10/13/2021, MD

## 2021-09-13 NOTE — Patient Instructions (Signed)
Welcome to [facility] at [place]! It was a pleasure meeting you today.  As discussed, Please schedule a 1 month follow up visit today.  PLEASE NOTE:  If you had any LAB tests please let us know if you have not heard back within a few days. You may see your results on MyChart before we have a chance to review them but we will give you a call once they are reviewed by us. If we ordered any REFERRALS today, please let us know if you have not heard from their office within the next week.  Let us know through MyChart if you are needing REFILLS, or have your pharmacy send us the request. You can also use MyChart to communicate with me or any office staff.  Please try these tips to maintain a healthy lifestyle:  Eat most of your calories during the day when you are active. Eliminate processed foods including packaged sweets (pies, cakes, cookies), reduce intake of potatoes, white bread, white pasta, and white rice. Look for whole grain options, oat flour or almond flour.  Each meal should contain half fruits/vegetables, one quarter protein, and one quarter carbs (no bigger than a computer mouse).  Cut down on sweet beverages. This includes juice, soda, and sweet tea. Also watch fruit intake, though this is a healthier sweet option, it still contains natural sugar! Limit to 3 servings daily.  Drink at least 1 glass of water with each meal and aim for at least 8 glasses per day  Exercise at least 150 minutes every week.   

## 2021-09-27 LAB — COLOGUARD: Cologuard: NEGATIVE

## 2021-09-28 LAB — COLOGUARD
COLOGUARD: NEGATIVE
Cologuard: NEGATIVE

## 2021-10-02 ENCOUNTER — Encounter: Payer: Self-pay | Admitting: Family Medicine

## 2021-10-13 ENCOUNTER — Ambulatory Visit: Payer: Managed Care, Other (non HMO) | Admitting: Family Medicine

## 2021-10-18 ENCOUNTER — Ambulatory Visit (INDEPENDENT_AMBULATORY_CARE_PROVIDER_SITE_OTHER): Payer: 59 | Admitting: Family Medicine

## 2021-10-18 ENCOUNTER — Encounter: Payer: Self-pay | Admitting: Family Medicine

## 2021-10-18 VITALS — BP 134/80 | HR 72 | Temp 98.1°F | Ht 72.0 in | Wt 277.2 lb

## 2021-10-18 DIAGNOSIS — I10 Essential (primary) hypertension: Secondary | ICD-10-CM

## 2021-10-18 MED ORDER — AMLODIPINE BESYLATE 10 MG PO TABS: 10.0000 mg | ORAL_TABLET | Freq: Every day | ORAL | 1 refills | Status: DC

## 2021-10-18 NOTE — Progress Notes (Signed)
   Subjective:     Patient ID: Douglas Pena, male    DOB: Jan 20, 1966, 56 y.o.   MRN: [id number]  Chief Complaint  Patient presents with   Follow-up    4 week follow-up on HTN, took last pill this morning Pt stated he did have some swelling in ankles, but they went down      HPI  HTN-Pt is on amlodipine 5mg   Bp's running 134-150/80's.  No ha/dizziness/cp/palp/edema/cough/sob/ED  Health Maintenance Due  Topic Date Due   COLONOSCOPY (Pts 45-91yrs Insurance coverage will need to be confirmed)  Never done    History reviewed. No pertinent past medical history.  Past Surgical History:  Procedure Laterality Date   ROTATOR CUFF REPAIR Bilateral     Outpatient Medications Prior to Visit  Medication Sig Dispense Refill   amLODipine (NORVASC) 5 MG tablet Take 1 tablet (5 mg total) by mouth daily. 30 tablet 1   No facility-administered medications prior to visit.    No Known Allergies ROS neg/noncontributory except as noted HPI/below      Objective:     BP 134/80 (BP Location: Left Arm, Patient Position: Sitting, Cuff Size: Large)   Pulse 72   Temp 98.1 F (36.7 C) (Temporal)   Ht 6' (1.829 m)   Wt 277 lb 4 oz (125.8 kg)   SpO2 97%   BMI 37.60 kg/m  Wt Readings from Last 3 Encounters:  10/18/21 277 lb 4 oz (125.8 kg)  09/13/21 282 lb 6 oz (128.1 kg)  04/15/19 283 lb 6.4 oz (128.5 kg)    Physical Exam   Gen: WDWN NAD HEENT: NCAT, conjunctiva not injected, sclera nonicteric CARDIAC: RRR, S1S2+, no murmur. LUNGS: CTAB. No wheezes EXT:  no edema MSK: no gross abnormalities.  NEURO: A&O x3.  CN II-XII intact.  PSYCH: normal mood. Good eye contact vitiligo     Assessment & Plan:   Problem List Items Addressed This Visit       Cardiovascular and Mediastinum   Primary hypertension - Primary   Relevant Medications   amLODipine (NORVASC) 10 MG tablet   HTN-chronic.  Not ideal.  Increase amlodipine to 10mg   monitor bp's  message in 2-3 wks w/readings and  any Side effects.    F/u 67mo.  Continue diet/exercise  Meds ordered this encounter  Medications   amLODipine (NORVASC) 10 MG tablet    Sig: Take 1 tablet (10 mg total) by mouth daily.    Dispense:  90 tablet    Refill:  1    , MD

## 2021-10-18 NOTE — Patient Instructions (Signed)
It was very nice to see you today!  Increase amlodipine to 10mg .  Message in 2-3 weeks w/progress.     PLEASE NOTE:  If you had any lab tests please let know if you have not heard back within a few days. You may see your results on MyChart before we have a chance to review them but we will give you a call once they are reviewed by [place]. If we ordered any referrals today, please let us know if you have not heard from their office within the next week.   Please try these tips to maintain a healthy lifestyle:  Eat most of your calories during the day when you are active. Eliminate processed foods including packaged sweets (pies, cakes, cookies), reduce intake of potatoes, white bread, white pasta, and white rice. Look for whole grain options, oat flour or almond flour.  Each meal should contain half fruits/vegetables, one quarter protein, and one quarter carbs (no bigger than a computer mouse).  Cut down on sweet beverages. This includes juice, soda, and sweet tea. Also watch fruit intake, though this is a healthier sweet option, it still contains natural sugar! Limit to 3 servings daily.  Drink at least 1 glass of water with each meal and aim for at least 8 glasses per day  Exercise at least 150 minutes every week.

## 2021-12-07 ENCOUNTER — Encounter: Payer: Self-pay | Admitting: Family Medicine

## 2022-01-16 ENCOUNTER — Ambulatory Visit (LOCAL_COMMUNITY_HEALTH_CENTER): Payer: 59

## 2022-01-16 DIAGNOSIS — Z111 Encounter for screening for respiratory tuberculosis: Secondary | ICD-10-CM

## 2022-01-19 ENCOUNTER — Ambulatory Visit (LOCAL_COMMUNITY_HEALTH_CENTER): Payer: 59

## 2022-01-19 DIAGNOSIS — Z111 Encounter for screening for respiratory tuberculosis: Secondary | ICD-10-CM

## 2022-01-19 LAB — TB SKIN TEST
Induration: 0 mm
TB Skin Test: NEGATIVE

## 2022-01-25 ENCOUNTER — Encounter: Payer: Self-pay | Admitting: *Deleted

## 2022-03-15 ENCOUNTER — Ambulatory Visit (INDEPENDENT_AMBULATORY_CARE_PROVIDER_SITE_OTHER): Payer: 59

## 2022-03-15 DIAGNOSIS — Z23 Encounter for immunization: Secondary | ICD-10-CM | POA: Diagnosis not present

## 2022-03-15 NOTE — Progress Notes (Signed)
Pt here for 2nd dose shingles vaccine for Dr. Cherlynn Kaiser. Injection given in left deltoid. Pt tolerated well.

## 2022-06-04 ENCOUNTER — Other Ambulatory Visit: Payer: Self-pay | Admitting: Family Medicine

## 2022-06-05 NOTE — Telephone Encounter (Signed)
Attempted to contact pt but no answer , lvm to cb

## 2022-07-15 ENCOUNTER — Encounter: Payer: Self-pay | Admitting: Family Medicine

## 2022-07-16 ENCOUNTER — Other Ambulatory Visit: Payer: Self-pay

## 2022-07-16 MED ORDER — TELMISARTAN 40 MG PO TABS: 40.0000 mg | ORAL_TABLET | Freq: Two times a day (BID) | ORAL | 0 refills | Status: DC

## 2022-07-16 MED ORDER — TELMISARTAN 40 MG PO TABS: 40.0000 mg | ORAL_TABLET | Freq: Every day | ORAL | 0 refills | Status: DC

## 2022-08-08 ENCOUNTER — Encounter: Payer: Self-pay | Admitting: Family Medicine

## 2022-08-08 ENCOUNTER — Ambulatory Visit: Payer: BC Managed Care – PPO | Admitting: Family Medicine

## 2022-08-08 VITALS — BP 148/90 | HR 65 | Temp 97.5°F | Resp 18 | Ht 72.0 in | Wt 277.2 lb

## 2022-08-08 DIAGNOSIS — R7303 Prediabetes: Secondary | ICD-10-CM

## 2022-08-08 DIAGNOSIS — R946 Abnormal results of thyroid function studies: Secondary | ICD-10-CM

## 2022-08-08 DIAGNOSIS — I10 Essential (primary) hypertension: Secondary | ICD-10-CM | POA: Diagnosis not present

## 2022-08-08 LAB — COMPREHENSIVE METABOLIC PANEL
ALT: 26 U/L (ref 0–53)
AST: 26 U/L (ref 0–37)
Albumin: 4.6 g/dL (ref 3.5–5.2)
Alkaline Phosphatase: 70 U/L (ref 39–117)
BUN: 18 mg/dL (ref 6–23)
CO2: 25 mEq/L (ref 19–32)
Calcium: 9.9 mg/dL (ref 8.4–10.5)
Chloride: 103 mEq/L (ref 96–112)
Creatinine, Ser: 1.16 mg/dL (ref 0.40–1.50)
GFR: 71.06 mL/min (ref >60.00)
Glucose, Bld: 156 mg/dL — ABNORMAL HIGH (ref 70–99)
Potassium: 4.7 mEq/L (ref 3.5–5.1)
Sodium: 136 mEq/L (ref 135–145)
Total Bilirubin: 0.6 mg/dL (ref 0.2–1.2)
Total Protein: 7.6 g/dL (ref 6.0–8.3)

## 2022-08-08 LAB — CBC WITH DIFFERENTIAL/PLATELET
Basophils Absolute: 0 10*3/uL (ref 0.0–0.1)
Basophils Relative: 0.4 % (ref 0.0–3.0)
Eosinophils Absolute: 0.1 10*3/uL (ref 0.0–0.7)
Eosinophils Relative: 2.3 % (ref 0.0–5.0)
HCT: 43.6 % (ref 39.0–52.0)
Hemoglobin: 14.6 g/dL (ref 13.0–17.0)
Lymphocytes Relative: 30.5 % (ref 12.0–46.0)
Lymphs Abs: 1.8 10*3/uL (ref 0.7–4.0)
MCHC: 33.4 g/dL (ref 30.0–36.0)
MCV: 91.3 fl (ref 78.0–100.0)
Monocytes Absolute: 0.5 10*3/uL (ref 0.1–1.0)
Monocytes Relative: 8.5 % (ref 3.0–12.0)
Neutro Abs: 3.5 10*3/uL (ref 1.4–7.7)
Neutrophils Relative %: 58.3 % (ref 43.0–77.0)
Platelets: 252 10*3/uL (ref 150.0–400.0)
RBC: 4.78 Mil/uL (ref 4.22–5.81)
RDW: 13.6 % (ref 11.5–15.5)
WBC: 6.1 10*3/uL (ref 4.0–10.5)

## 2022-08-08 LAB — HEMOGLOBIN A1C: Hgb A1c MFr Bld: 6.7 % — ABNORMAL HIGH (ref 4.6–6.5)

## 2022-08-08 LAB — LIPID PANEL
Cholesterol: 203 mg/dL — ABNORMAL HIGH (ref 0–200)
HDL: 43.1 mg/dL (ref >39.00)
LDL Cholesterol: 132 mg/dL — ABNORMAL HIGH (ref 0–99)
NonHDL: 160.12
Total CHOL/HDL Ratio: 5
Triglycerides: 139 mg/dL (ref 0.0–149.0)
VLDL: 27.8 mg/dL (ref 0.0–40.0)

## 2022-08-08 LAB — TSH: TSH: 4.52 u[IU]/mL (ref 0.35–5.50)

## 2022-08-08 LAB — T4, FREE: Free T4: 0.75 ng/dL (ref 0.60–1.60)

## 2022-08-08 MED ORDER — TELMISARTAN 80 MG PO TABS: 80.0000 mg | ORAL_TABLET | Freq: Every day | ORAL | 1 refills | Status: DC

## 2022-08-08 NOTE — Assessment & Plan Note (Signed)
Chronic.  Not controlled.  Increase micardis to 80mg .  Monitor bps.  Work on diet/exercise

## 2022-08-08 NOTE — Assessment & Plan Note (Signed)
Chronic.  Controlled.  Work on diet/exercise 

## 2022-08-08 NOTE — Progress Notes (Signed)
   Subjective:     Patient ID: Douglas Pena, male    DOB: January 18, 1966, 57 y.o.   MRN: [id number]  Chief Complaint  Patient presents with   Medical Management of Chronic Issues    2 week follow-up on HTN     HPI  HTN-Pt is on micardis 40mg  as amlodipine caused edema so stopped in May and bp's went up.  Bp's running upper 130's/upper 80's.  No ha/dizziness/cp/palp/edema/cough/sob   eats a lot of potassium foods. No ED.  There are no preventive care reminders to display for this patient.  History reviewed. No pertinent past medical history.  Past Surgical History:  Procedure Laterality Date   ROTATOR CUFF REPAIR Bilateral      Current Outpatient Medications:    telmisartan (MICARDIS) 80 MG tablet, Take 1 tablet (80 mg total) by mouth daily., Disp: 90 tablet, Rfl: 1  No Known Allergies ROS neg/noncontributory except as noted HPI/below      Objective:     BP (!) 148/90 (BP Location: Left Arm, Patient Position: Sitting, Cuff Size: Large)   Pulse 65   Temp (!) 97.5 F (36.4 C) (Temporal)   Resp 18   Ht 6' (1.829 m)   Wt 277 lb 4 oz (125.8 kg)   SpO2 98%   BMI 37.60 kg/m  Wt Readings from Last 3 Encounters:  08/08/22 277 lb 4 oz (125.8 kg)  10/18/21 277 lb 4 oz (125.8 kg)  09/13/21 282 lb 6 oz (128.1 kg)    Physical Exam   Gen: WDWN NAD HEENT: NCAT, conjunctiva not injected, sclera nonicteric NECK:  supple, no thyromegaly, no nodes, no carotid bruits CARDIAC: RRR, S1S2+, no murmur. DP 2+B LUNGS: CTAB. No wheezes ABDOMEN:  BS+, soft, NTND, No HSM, no masses EXT:  no edema MSK: no gross abnormalities.  NEURO: A&O x3.  CN II-XII intact.  PSYCH: normal mood. Good eye contact vitiligo     Assessment & Plan:  Primary hypertension Assessment & Plan: Chronic.  Not controlled.  Increase micardis to 80mg .  Monitor bps.  Work on diet/exercise  Orders: -     Comprehensive metabolic panel -     Lipid panel -     Telmisartan; Take 1 tablet (80 mg total) by mouth  daily.  Dispense: 90 tablet; Refill: 1 -     CBC with Differential/Platelet  Prediabetes Assessment & Plan: Chronic.  Controlled.  Work on diet/exercise  Orders: -     Comprehensive metabolic panel -     Lipid panel -     Hemoglobin A1c  Abnormal thyroid function test -     TSH -     T4, free    Return in about 6 months (around 02/07/2023) for HTN.  Angelena Sole, MD

## 2022-08-08 NOTE — Patient Instructions (Signed)
It was very nice to see you today!  Increase telmisartan to 80mg .  Monitor blood pressures   PLEASE NOTE:  If you had any lab tests please let us know if you have not heard back within a few days. You may see your results on MyChart before we have a chance to review them but we will give you a call once they are reviewed by [place]. If we ordered any referrals today, please let us know if you have not heard from their office within the next week.   Please try these tips to maintain a healthy lifestyle:  Eat most of your calories during the day when you are active. Eliminate processed foods including packaged sweets (pies, cakes, cookies), reduce intake of potatoes, white bread, white pasta, and white rice. Look for whole grain options, oat flour or almond flour.  Each meal should contain half fruits/vegetables, one quarter protein, and one quarter carbs (no bigger than a computer mouse).  Cut down on sweet beverages. This includes juice, soda, and sweet tea. Also watch fruit intake, though this is a healthier sweet option, it still contains natural sugar! Limit to 3 servings daily.  Drink at least 1 glass of water with each meal and aim for at least 8 glasses per day  Exercise at least 150 minutes every week.

## 2022-08-09 NOTE — Progress Notes (Signed)
Cholesterol a little elevated.   Sugars-technically diabetic now!.  Does he want to do a virtual visit to discuss options or work on diet/exercise and see me in 3 months to reck?

## 2023-01-16 ENCOUNTER — Encounter: Payer: Self-pay | Admitting: Family Medicine

## 2023-01-18 ENCOUNTER — Ambulatory Visit: Payer: BC Managed Care – PPO | Admitting: Family Medicine

## 2023-01-18 VITALS — BP 160/88 | HR 61 | Temp 97.8°F | Resp 18 | Ht 72.0 in | Wt 284.0 lb

## 2023-01-18 DIAGNOSIS — R0609 Other forms of dyspnea: Secondary | ICD-10-CM

## 2023-01-18 DIAGNOSIS — I10 Essential (primary) hypertension: Secondary | ICD-10-CM | POA: Diagnosis not present

## 2023-01-18 DIAGNOSIS — R0789 Other chest pain: Secondary | ICD-10-CM | POA: Diagnosis not present

## 2023-01-18 MED ORDER — NITROGLYCERIN 0.4 MG SL SUBL: 0.4000 mg | SUBLINGUAL_TABLET | SUBLINGUAL | 0 refills | Status: AC | PRN

## 2023-01-18 MED ORDER — HYDROCHLOROTHIAZIDE 25 MG PO TABS: 25.0000 mg | ORAL_TABLET | Freq: Every day | ORAL | 1 refills | Status: DC

## 2023-01-18 MED ORDER — HYDROCHLOROTHIAZIDE 25 MG PO TABS: 25.0000 mg | ORAL_TABLET | Freq: Every day | ORAL | 3 refills | Status: DC

## 2023-01-18 NOTE — Patient Instructions (Signed)

## 2023-01-18 NOTE — Progress Notes (Signed)
Subjective:     Patient ID: Douglas Pena, male    DOB: Sep 22, 1965, 57 y.o.   MRN: [id number]  Chief Complaint  Patient presents with   Issues while walking long distance    Had 2 episodes on Tuesday night, felt really out of breath and fatigued while walking long distances at a hockey game, was fine when sitting down, has not happened since Tuesday    HPI On 12/3. On 11/30-walked same hill and fine Was walking to and from stadium at hockey game and had DOE and fatigue and chest tightness on top of hill and on way back down again.  No rad/no sweats/dizziness,etc..  it was really cold out as well.  No h/o asthma. There is a hill but this time worst. .  Resolved w/rest.  No/ha/dizziness. No recurrance Bp's have been mid 130's/80's.  Since episode, still normal activity->10K steps/day-no problems-hill yesterday and fine.   Stress test sev yrs ago and cp, etc when really cold out.   FH CAD pat gma 70's and gpa-93 m gpa in 30's(DM as well).  Health Maintenance Due  Topic Date Due   HIV Screening  Never done   Hepatitis C Screening  Never done    History reviewed. No pertinent past medical history.  Past Surgical History:  Procedure Laterality Date   ROTATOR CUFF REPAIR Bilateral      Current Outpatient Medications:    nitroGLYCERIN (NITROSTAT) 0.4 MG SL tablet, Place 1 tablet (0.4 mg total) under the tongue every 5 (five) minutes as needed for chest pain., Disp: 50 tablet, Rfl: 0   telmisartan (MICARDIS) 80 MG tablet, Take 1 tablet (80 mg total) by mouth daily., Disp: 90 tablet, Rfl: 1   hydrochlorothiazide (HYDRODIURIL) 25 MG tablet, Take 1 tablet (25 mg total) by mouth daily., Disp: 30 tablet, Rfl: 1  No Known Allergies ROS neg/noncontributory except as noted HPI/below      Objective:     BP (!) 160/88 (BP Location: Left Arm, Patient Position: Sitting, Cuff Size: Large)   Pulse 61   Temp 97.8 F (36.6 C) (Temporal)   Resp 18   Ht 6' (1.829 m)   Wt 284 lb (128.8 kg)    SpO2 95%   BMI 38.52 kg/m  Wt Readings from Last 3 Encounters:  01/18/23 284 lb (128.8 kg)  08/08/22 277 lb 4 oz (125.8 kg)  10/18/21 277 lb 4 oz (125.8 kg)    Physical Exam   Gen: WDWN NAD HEENT: NCAT, conjunctiva not injected, sclera nonicteric NECK:  supple, no thyromegaly, no nodes, no carotid bruits CARDIAC: RRR, S1S2+, no murmur. DP 2+B LUNGS: CTAB. No wheezes ABDOMEN:  BS+, soft, NTND, No HSM, no masses EXT:  no edema MSK: no gross abnormalities.  NEURO: A&O x3.  CN II-XII intact.  PSYCH: normal mood. Good eye contact  EKG NSR. No st changes    Assessment & Plan:  Primary hypertension Assessment & Plan: Chronic.  Not controlled.  But was(last checked 1 mo ago).  Not sure if anxious, out of control now, other.  Will add hydrochlorothiazide 25mg   continue telmisartan 80mg .  Monitor bps.  Work on diet/exercise  Orders: -     EKG 12-Lead -     hydroCHLOROthiazide; Take 1 tablet (25 mg total) by mouth daily.  Dispense: 30 tablet; Refill: 1 -     Ambulatory referral to Cardiology  DOE (dyspnea on exertion) -     EKG 12-Lead -     Ambulatory referral  to Cardiology  Chest tightness -     Ambulatory referral to Cardiology  Other orders -     Nitroglycerin; Place 1 tablet (0.4 mg total) under the tongue every 5 (five) minutes as needed for chest pain.  Dispense: 50 tablet; Refill: 0  DOE/chest tightness-was cold out as well.  Pt has HTN, DM, some FH CAD.  Ntg to have on hand.  Refer Card.  Return for as sch 12/23.  Angelena Sole, MD

## 2023-01-18 NOTE — Assessment & Plan Note (Signed)
Chronic.  Not controlled.  But was(last checked 1 mo ago).  Not sure if anxious, out of control now, other.  Will add hydrochlorothiazide 25mg   continue telmisartan 80mg .  Monitor bps.  Work on diet/exercise

## 2023-01-19 ENCOUNTER — Encounter: Payer: Self-pay | Admitting: Family Medicine

## 2023-01-22 ENCOUNTER — Encounter: Payer: Self-pay | Admitting: Family Medicine

## 2023-02-03 ENCOUNTER — Other Ambulatory Visit: Payer: Self-pay | Admitting: Family Medicine

## 2023-02-03 DIAGNOSIS — I10 Essential (primary) hypertension: Secondary | ICD-10-CM

## 2023-02-04 ENCOUNTER — Ambulatory Visit: Payer: BC Managed Care – PPO | Admitting: Family Medicine

## 2023-02-04 ENCOUNTER — Encounter: Payer: Self-pay | Admitting: Family Medicine

## 2023-02-04 VITALS — BP 130/82 | HR 61 | Temp 97.1°F | Ht 72.0 in | Wt 283.4 lb

## 2023-02-04 DIAGNOSIS — Z125 Encounter for screening for malignant neoplasm of prostate: Secondary | ICD-10-CM

## 2023-02-04 DIAGNOSIS — I10 Essential (primary) hypertension: Secondary | ICD-10-CM

## 2023-02-04 DIAGNOSIS — Z7985 Long-term (current) use of injectable non-insulin antidiabetic drugs: Secondary | ICD-10-CM | POA: Diagnosis not present

## 2023-02-04 DIAGNOSIS — E119 Type 2 diabetes mellitus without complications: Secondary | ICD-10-CM | POA: Insufficient documentation

## 2023-02-04 LAB — LIPID PANEL
Cholesterol: 199 mg/dL (ref 0–200)
HDL: 37.2 mg/dL — ABNORMAL LOW (ref >39.00)
LDL Cholesterol: 99 mg/dL (ref 0–99)
NonHDL: 161.67
Total CHOL/HDL Ratio: 5
Triglycerides: 313 mg/dL — ABNORMAL HIGH (ref 0.0–149.0)
VLDL: 62.6 mg/dL — ABNORMAL HIGH (ref 0.0–40.0)

## 2023-02-04 LAB — COMPREHENSIVE METABOLIC PANEL
ALT: 28 U/L (ref 0–53)
AST: 21 U/L (ref 0–37)
Albumin: 4.2 g/dL (ref 3.5–5.2)
Alkaline Phosphatase: 74 U/L (ref 39–117)
BUN: 16 mg/dL (ref 6–23)
CO2: 24 meq/L (ref 19–32)
Calcium: 9.3 mg/dL (ref 8.4–10.5)
Chloride: 103 meq/L (ref 96–112)
Creatinine, Ser: 1.01 mg/dL (ref 0.40–1.50)
GFR: 83.61 mL/min (ref >60.00)
Glucose, Bld: 155 mg/dL — ABNORMAL HIGH (ref 70–99)
Potassium: 4.3 meq/L (ref 3.5–5.1)
Sodium: 137 meq/L (ref 135–145)
Total Bilirubin: 0.4 mg/dL (ref 0.2–1.2)
Total Protein: 7.2 g/dL (ref 6.0–8.3)

## 2023-02-04 LAB — PSA: PSA: 1.13 ng/mL (ref 0.10–4.00)

## 2023-02-04 LAB — CBC WITH DIFFERENTIAL/PLATELET
Basophils Absolute: 0 10*3/uL (ref 0.0–0.1)
Basophils Relative: 0.5 % (ref 0.0–3.0)
Eosinophils Absolute: 0.2 10*3/uL (ref 0.0–0.7)
Eosinophils Relative: 2.5 % (ref 0.0–5.0)
HCT: 41.9 % (ref 39.0–52.0)
Hemoglobin: 14.2 g/dL (ref 13.0–17.0)
Lymphocytes Relative: 27.4 % (ref 12.0–46.0)
Lymphs Abs: 1.8 10*3/uL (ref 0.7–4.0)
MCHC: 33.9 g/dL (ref 30.0–36.0)
MCV: 91.3 fL (ref 78.0–100.0)
Monocytes Absolute: 0.5 10*3/uL (ref 0.1–1.0)
Monocytes Relative: 7.6 % (ref 3.0–12.0)
Neutro Abs: 4 10*3/uL (ref 1.4–7.7)
Neutrophils Relative %: 62 % (ref 43.0–77.0)
Platelets: 319 10*3/uL (ref 150.0–400.0)
RBC: 4.59 Mil/uL (ref 4.22–5.81)
RDW: 13 % (ref 11.5–15.5)
WBC: 6.4 10*3/uL (ref 4.0–10.5)

## 2023-02-04 LAB — HEMOGLOBIN A1C: Hgb A1c MFr Bld: 7.3 % — ABNORMAL HIGH (ref 4.6–6.5)

## 2023-02-04 LAB — TSH: TSH: 5.25 u[IU]/mL (ref 0.35–5.50)

## 2023-02-04 MED ORDER — HYDROCHLOROTHIAZIDE 25 MG PO TABS: 25.0000 mg | ORAL_TABLET | Freq: Every day | ORAL | 1 refills | Status: DC

## 2023-02-04 MED ORDER — TIRZEPATIDE 2.5 MG/0.5ML ~~LOC~~ SOAJ: 2.5000 mg | SUBCUTANEOUS | 0 refills | Status: AC

## 2023-02-04 NOTE — Patient Instructions (Addendum)
It was very nice to see you today!  Happy holidays  May need colace 100mg  daily and miralax if constipated from mounjaro   PLEASE NOTE:  If you had any lab tests please let us know if you have not heard back within a few days. You may see your results on MyChart before we have a chance to review them but we will give you a call once they are reviewed by [place]. If we ordered any referrals today, please let us know if you have not heard from their office within the next week.   Please try these tips to maintain a healthy lifestyle:  Eat most of your calories during the day when you are active. Eliminate processed foods including packaged sweets (pies, cakes, cookies), reduce intake of potatoes, white bread, white pasta, and white rice. Look for whole grain options, oat flour or almond flour.  Each meal should contain half fruits/vegetables, one quarter protein, and one quarter carbs (no bigger than a computer mouse).  Cut down on sweet beverages. This includes juice, soda, and sweet tea. Also watch fruit intake, though this is a healthier sweet option, it still contains natural sugar! Limit to 3 servings daily.  Drink at least 1 glass of water with each meal and aim for at least 8 glasses per day  Exercise at least 150 minutes every week.

## 2023-02-04 NOTE — Progress Notes (Signed)
Sugars worse and triglycerides are elevated.  He is starting new diet plan and hopefully starting on Mounjaro Will see how he's doing in 3 months at follow-up

## 2023-02-04 NOTE — Assessment & Plan Note (Signed)
Chronic.  At goal 79m ago.  Working on diet/exercise, but still struggles.  Will add mounjaro 2.5mg  weekly and titrate.  SED.  Has htn as well so feel he needs this.

## 2023-02-04 NOTE — Assessment & Plan Note (Signed)
Chronic.  controlled.  Continue  hydrochlorothiazide 25mg   continue telmisartan 80mg .  Monitor bps.  Work on diet/exercise

## 2023-02-04 NOTE — Progress Notes (Signed)
Subjective:     Patient ID: Douglas Pena, male    DOB: [birth year], 57 y.o.   MRN: [id number]  Chief Complaint  Patient presents with   6 month follow-up    Reports that BP readings have been good at home since starting the medication.   Hypertension    HPI  HTN-Pt is on micardis 80mg  and hydrochlorothiazide 25mg   Bp's running120-130/70/80  No ha/dizziness/cp/palp/edema/cough/sob   eats a lot of potassium foods. No ED. Did get edema on amlodipine.   No DOE anymore. DM-working on diet/exercise some.  Starting MIND diet w/wife.   Health Maintenance Due  Topic Date Due   FOOT EXAM  Never done   OPHTHALMOLOGY EXAM  Never done   Diabetic kidney evaluation - Urine ACR  09/14/2022     History reviewed. No pertinent past medical history.  Past Surgical History:  Procedure Laterality Date   ROTATOR CUFF REPAIR Bilateral      Current Outpatient Medications:    nitroGLYCERIN (NITROSTAT) 0.4 MG SL tablet, Place 1 tablet (0.4 mg total) under the tongue every 5 (five) minutes as needed for chest pain., Disp: 50 tablet, Rfl: 0   telmisartan (MICARDIS) 80 MG tablet, TAKE 1 TABLET BY MOUTH EVERY DAY, Disp: 90 tablet, Rfl: 1   tirzepatide (MOUNJARO) 2.5 MG/0.5ML Pen, Inject 2.5 mg into the skin once a week., Disp: 2 mL, Rfl: 0   hydrochlorothiazide (HYDRODIURIL) 25 MG tablet, Take 1 tablet (25 mg total) by mouth daily., Disp: 90 tablet, Rfl: 1  No Known Allergies ROS neg/noncontributory except as noted HPI/below      Objective:     BP 130/82   Pulse 61   Temp (!) 97.1 F (36.2 C) (Temporal)   Ht 6' (1.829 m)   Wt 283 lb 6.4 oz (128.5 kg)   SpO2 96%   BMI 38.44 kg/m  Wt Readings from Last 3 Encounters:  02/04/23 283 lb 6.4 oz (128.5 kg)  01/18/23 284 lb (128.8 kg)  08/08/22 277 lb 4 oz (125.8 kg)    Physical Exam   Gen: WDWN NAD HEENT: NCAT, conjunctiva not injected, sclera nonicteric NECK:  supple, no thyromegaly, no nodes, no carotid bruits CARDIAC: RRR, S1S2+,  no murmur. DP 2+B LUNGS: CTAB. No wheezes ABDOMEN:  BS+, soft, NTND, No HSM, no masses EXT:  no edema MSK: no gross abnormalities.  NEURO: A&O x3.  CN II-XII intact.  PSYCH: normal mood. Good eye contact vitiligo     Assessment & Plan:  Primary hypertension Assessment & Plan: Chronic.  controlled.  Continue  hydrochlorothiazide 25mg   continue telmisartan 80mg .  Monitor bps.  Work on diet/exercise  Orders: -     CBC with Differential/Platelet -     Comprehensive metabolic panel -     hydroCHLOROthiazide; Take 1 tablet (25 mg total) by mouth daily.  Dispense: 90 tablet; Refill: 1  Type 2 diabetes mellitus without complication, without long-term current use of insulin (HCC) Assessment & Plan: Chronic.  At goal 82m ago.  Working on diet/exercise, but still struggles.  Will add mounjaro 2.5mg  weekly and titrate.  SED.  Has htn as well so feel he needs this.   Orders: -     Comprehensive metabolic panel -     TSH -     Lipid panel -     Hemoglobin A1c -     Tirzepatide; Inject 2.5 mg into the skin once a week.  Dispense: 2 mL; Refill: 0  Screening for prostate cancer -  PSA    Return in about 3 months (around 05/05/2023) for DM, HTN.  Angelena Sole, MD

## 2023-03-12 ENCOUNTER — Encounter: Payer: Self-pay | Admitting: Family Medicine

## 2023-03-13 ENCOUNTER — Telehealth: Payer: Self-pay

## 2023-03-13 ENCOUNTER — Other Ambulatory Visit (HOSPITAL_COMMUNITY): Payer: Self-pay

## 2023-03-13 NOTE — Telephone Encounter (Signed)
Pharmacy Patient Advocate Encounter   Received notification from Patient Advice Request messages that prior authorization for [facility] is required/requested.   Insurance verification completed.   The patient is insured through [facility] .   Per test claim: PA required; PA submitted to above mentioned insurance via CoverMyMeds Key/confirmation #/EOC [id number] Status is pending

## 2023-03-13 NOTE — Telephone Encounter (Signed)
Pharmacy Patient Advocate Encounter  Received notification from [facility] that Prior Authorization for [facility] 2.5MG /0.5ML auto-injectors has been CANCELLED due to:Your PA request has been closed. Mounjaro injection E11.9-Type 2 diabetes mellitus without complications There was a claim run at the pharmacy and reversed. test claim paid with date 03/13/23 and 90 days out. The pharmacy is running the claim with the ICD-10, No PA needed at this time.   PA #/Case ID/Reference #: [id number]   Per test claim: The current 28 day co-pay is, $25.  No PA needed at this time. This test claim was processed through [facility]- copay amounts may vary at other pharmacies due to pharmacy/plan contracts, or as the patient moves through the different stages of their insurance plan.

## 2023-03-19 ENCOUNTER — Encounter (HOSPITAL_BASED_OUTPATIENT_CLINIC_OR_DEPARTMENT_OTHER): Payer: Self-pay | Admitting: Cardiology

## 2023-03-19 ENCOUNTER — Ambulatory Visit (HOSPITAL_BASED_OUTPATIENT_CLINIC_OR_DEPARTMENT_OTHER): Payer: 59 | Admitting: Cardiology

## 2023-03-19 ENCOUNTER — Other Ambulatory Visit (HOSPITAL_BASED_OUTPATIENT_CLINIC_OR_DEPARTMENT_OTHER): Payer: Self-pay

## 2023-03-19 VITALS — BP 144/90 | HR 65 | Ht 72.0 in | Wt 291.8 lb

## 2023-03-19 DIAGNOSIS — Z5181 Encounter for therapeutic drug level monitoring: Secondary | ICD-10-CM

## 2023-03-19 DIAGNOSIS — R079 Chest pain, unspecified: Secondary | ICD-10-CM

## 2023-03-19 DIAGNOSIS — I10 Essential (primary) hypertension: Secondary | ICD-10-CM | POA: Diagnosis not present

## 2023-03-19 DIAGNOSIS — E785 Hyperlipidemia, unspecified: Secondary | ICD-10-CM | POA: Diagnosis not present

## 2023-03-19 MED ORDER — ATORVASTATIN CALCIUM 20 MG PO TABS
20.0000 mg | ORAL_TABLET | Freq: Every day | ORAL | 3 refills | Status: DC
  Filled 2023-03-19: qty 90, 90d supply, fill #0

## 2023-03-19 NOTE — Patient Instructions (Addendum)
 Medication Instructions:  START ATORVASTATIN  20 MG DAILY   *If you need a refill on your cardiac medications before your next appointment, please call your pharmacy*  Lab Work: BMET ABOUT A WEEK PRIOR TO CARDIAC CT  FASTING LIPID IN 3 MONTHS   If you have labs (blood work) drawn today and your tests are completely normal, you will receive your results only by: MyChart Message (if you have MyChart) OR A paper copy in the mail If you have any lab test that is abnormal or we need to change your treatment, we will call you to review the results.  Testing/Procedures: Your physician has requested that you have an echocardiogram. Echocardiography is a painless test that uses sound waves to create images of your heart. It provides your doctor with information about the size and shape of your heart and how well your heart's chambers and valves are working. This procedure takes approximately one hour. There are no restrictions for this procedure. Please do NOT wear cologne, perfume, aftershave, or lotions (deodorant is allowed). Please arrive 15 minutes prior to your appointment time.  Please note: We ask at that you not bring children with you during ultrasound (echo/ vascular) testing. Due to room size and safety concerns, children are not allowed in the ultrasound rooms during exams. Our front office staff cannot provide observation of children in our lobby area while testing is being conducted. An adult accompanying a patient to their appointment will only be allowed in the ultrasound room at the discretion of the ultrasound technician under special circumstances. We apologize for any inconvenience.   Your physician has requested that you have cardiac CT. Cardiac computed tomography (CT) is a painless test that uses an x-ray machine to take clear, detailed pictures of your heart. For further information please visit [email or web address] Please follow instruction sheet as given.  Follow-Up: At [facility], you and your health needs are our priority.  As part of our continuing mission to provide you with exceptional heart care, we have created designated Provider Care Teams.  These Care Teams include your primary Cardiologist (physician) and Advanced Practice Providers (APPs -  Physician Assistants and Nurse Practitioners) who all work together to provide you with the care you need, when you need it.  We recommend signing up for the patient portal called MyChart.  Sign up information is provided on this After Visit Summary.  MyChart is used to connect with patients for Virtual Visits (Telemedicine).  Patients are able to view lab/test results, encounter notes, upcoming appointments, etc.  Non-urgent messages can be sent to your provider as well.   To learn more about what you can do with MyChart, go to [email or web address].    Your next appointment:   4 month(s)  Provider:   Lonni Nanas, MD    Other Instructions MONITOR AND LOG YOUR BLOOD PRESSURE TWICE A DAY FOR 1 WEEK. CALL OR SEND MYCHART WITH THE READINGS   Your cardiac CT will be scheduled at one of the below locations:   [facility] [address] [place], [place] [address] [phone]  OR  [facility] [address] [place], [place] [address] [phone]  OR   [facility] [address] [place], [place] [address] [phone]  OR   [facility] [address] [place], [place] [address] [phone]  If scheduled at [facility], please arrive at the [facility] and [facility]'s Entrance (Entrance C2)  of [facility] 30 minutes prior to test start time. You can use the FREE valet parking offered at entrance C (encouraged to control the heart rate for the test)  Proceed to the [facility] Radiology Department (first floor) to check-in and test prep.  All radiology patients and guests  should use entrance C2 at [facility], accessed from [facility], even though the hospital's physical address listed is [address].    If scheduled at [facility] or [facility], please arrive 15 mins early for check-in and test prep.  There is spacious parking and easy access to the radiology department from the [facility] Heart and Vascular entrance. Please enter here and check-in with the desk attendant.   If scheduled at [facility], please arrive 30 minutes early for check-in and test prep.  Please follow these instructions carefully (unless otherwise directed):  An IV will be required for this test and Nitroglycerin  will be given.  Hold all erectile dysfunction medications at least 3 days (72 hrs) prior to test. (Ie viagra, cialis, sildenafil, tadalafil, etc)   On the Night Before the Test: Be sure to Drink plenty of water. Do not consume any caffeinated/decaffeinated beverages or chocolate 12 hours prior to your test. Do not take any antihistamines 12 hours prior to your test.  On the Day of the Test: Drink plenty of water until 1 hour prior to the test. Do not eat any food 1 hour prior to test. You may take your regular medications prior to the test.  Take metoprolol (Lopressor) two hours prior to test. If you take Furosemide/Hydrochlorothiazide /Spironolactone/Chlorthalidone, please HOLD on the morning of the test. Patients who wear a continuous glucose monitor MUST remove the device prior to scanning.  After the Test: Drink plenty of water. After receiving IV contrast, you may experience a mild flushed feeling. This is normal. On occasion, you may experience a mild rash up to 24 hours after the test. This is not dangerous. If this occurs, you can take Benadryl 25 mg, Zyrtec, Claritin, or Allegra and increase your fluid intake. (Patients taking Tikosyn should avoid Benadryl, and may take Zyrtec,  Claritin, or Allegra) If you experience trouble breathing, this can be serious. If it is severe call 911 IMMEDIATELY. If it is mild, please call our office.  We will call to schedule your test 2-4 weeks out understanding that some insurance companies will need an authorization prior to the service being performed.   For more information and frequently asked questions, please visit our website : [email or web address]  For non-scheduling related questions, please contact the cardiac imaging nurse navigator should you have any questions/concerns: Cardiac Imaging Nurse Navigators Direct Office Dial: [phone]   For scheduling needs, including cancellations and rescheduling, please call Brittany, [phone].   Cardiac CT Angiogram A cardiac CT angiogram is a procedure to look at the heart and the area around the heart. It may be done to help find the cause of chest pains or other symptoms of heart disease. During this procedure, a substance called contrast dye is injected into a vein in the arm. The contrast highlights the blood vessels in the area to be checked. A large X-ray machine (CT scanner), then takes detailed pictures of the heart and the surrounding area. The procedure is also sometimes called a coronary CT angiogram, coronary artery scanning, or CTA. A cardiac CT angiogram allows the health care provider to see how well blood is flowing to  and from the heart. The provider will be able to see if there are any problems, such as: Blockage or narrowing of the arteries in the heart. Fluid around the heart. Signs of weakness or disease in the muscles, valves, and tissues of the heart. Tell a health care provider about: Any allergies you have. This is especially important if you have had a previous allergic reaction to medicines, contrast dye, or iodine. All medicines you are taking, including vitamins, herbs, eye drops, creams, and over-the-counter medicines. Any bleeding  problems you have. Any surgeries you have had. Any medical conditions you have, including kidney problems or kidney failure. Whether you are pregnant or may be pregnant. Any anxiety disorders, chronic pain, or other conditions you have. These may increase your stress or prevent you from lying still. Any history of abnormal heart rhythms or heart procedures. What are the risks? Your provider will talk with you about risks. These may include: Bleeding. Infection. Allergic reactions to medicines or dyes. Damage to other structures or organs. Kidney damage from the contrast dye. Increased risk of cancer from radiation exposure. This risk is low. Talk with your provider about: The risks and benefits of testing. How you can receive the lowest dose of radiation. What happens before the procedure? Wear comfortable clothing and remove any jewelry, glasses, dentures, and hearing aids. Follow instructions from your provider about eating and drinking. These may include: 12 hours before the procedure Avoid caffeine. This includes tea, coffee, soda, energy drinks, and diet pills. Drink plenty of water or other fluids that do not have caffeine in them. Being well hydrated can prevent complications. 4-6 hours before the procedure Stop eating and drinking. This will reduce the risk of nausea from the contrast dye. Ask your provider about changing or stopping your regular medicines. These include: Diabetes medicines. Medicines to treat problems with erections (erectile dysfunction). If you have kidney problems, you may need to receive IV hydration before and after the test. What happens during the procedure?  Hair on your chest may need to be removed so that small sticky patches called electrodes can be placed on your chest. These will transmit information that helps to monitor your heart during the procedure. An IV will be inserted into one of your veins. You might be given a medicine to control your  heart rate during the procedure. This will help to ensure that good images are obtained. You will be asked to lie on an exam table. This table will slide in and out of the CT machine during the procedure. Contrast dye will be injected into the IV. You might feel warm, or you may get a metallic taste in your mouth. You may be given medicines to relax or dilate the arteries in your heart. If you are allergic to contrast dyes or iodine you may be given medicine before the test to reduce the risk of an allergic reaction. The table that you are lying on will move into the CT machine tunnel for the scan. The person running the machine will give you instructions while the scans are being done. You may be asked to: Keep your arms above your head. Hold your breath for short periods. Stay very still, even if the table is moving. The procedure may vary among providers and hospitals. What can I expect after the procedure? After your procedure, it is common to have: A metallic taste in your mouth from the contrast dye. A feeling of warmth. A headache from the heart medicine.  Follow these instructions at home: Take over-the-counter and prescription medicines only as told by your provider. If you are told, drink enough fluid to keep your pee pale yellow. This will help to flush the contrast dye out of your body. Most people can return to their normal activities right after the procedure. Ask your provider what activities are safe for you. It is up to you to get the results of your procedure. Ask your provider, or the department that is doing the procedure, when your results will be ready. Contact a health care provider if: You have any symptoms of allergy to the contrast dye. These include: Shortness of breath. Rash or hives. A racing heartbeat. You notice a change in your peeing (urination). This information is not intended to replace advice given to you by your health care provider. Make sure you  discuss any questions you have with your health care provider. Document Revised: 09/01/2021 Document Reviewed: 09/01/2021 Elsevier Patient Education  2024 [employer].

## 2023-03-19 NOTE — Progress Notes (Signed)
 Cardiology Office Note:    Date:  03/19/2023   ID:  MIZRAIM HARMENING, DOB [birth year], MRN [id number]  PCP:  Wendolyn Jenkins Jansky, MD  Cardiologist:  None  Electrophysiologist:  None   Referring MD: Wendolyn Jenkins Jansky, MD   Chief Complaint  Patient presents with   Chest Pain    History of Present Illness:    Douglas Pena is a 58 y.o. male with a hx of hypertension, T2DM who is referred by Dr. Wendolyn for evaluation of chest pain.  He reports that he was walking to a [employer], was walking uphill and started feeling chest tightness.  Reports tightness in center of chest that lasted about 5 minutes and resolved with rest.  States that when he was walking down the hill on the way back he felt similar chest tightness.  He denies any dyspnea, lightheadedness, syncope, lower extremity edema, palpitations.  No smoking history.  Never smoked.  Family history includes maternal grandfather had multiple MIs, first in early 55s, and died of MI in 75s.  No past medical history on file.  Past Surgical History:  Procedure Laterality Date   ROTATOR CUFF REPAIR Bilateral     Current Medications: Current Meds  Medication Sig   atorvastatin  (LIPITOR) 20 MG tablet Take 1 tablet (20 mg total) by mouth daily.   hydrochlorothiazide  (HYDRODIURIL ) 25 MG tablet Take 1 tablet (25 mg total) by mouth daily.   nitroGLYCERIN  (NITROSTAT ) 0.4 MG SL tablet Place 1 tablet (0.4 mg total) under the tongue every 5 (five) minutes as needed for chest pain.   telmisartan  (MICARDIS ) 80 MG tablet TAKE 1 TABLET BY MOUTH EVERY DAY     Allergies:   Patient has no known allergies.   Social History   Socioeconomic History   Marital status: Married    Spouse name: Not on file   Number of children: 2   Years of education: Not on file   Highest education level: Associate degree: occupational, scientist, product/process development, or vocational program  Occupational History   Occupation: traffic signals    Comment: state of [place]  Tobacco  Use   Smoking status: Never   Smokeless tobacco: Never  Vaping Use   Vaping status: Never Used  Substance and Sexual Activity   Alcohol use: Yes    Alcohol/week: 4.0 standard drinks of alcohol    Types: 2 Glasses of wine, 2 Cans of beer per week    Comment: occasionally   Drug use: Never   Sexual activity: Yes  Other Topics Concern   Not on file  Social History Narrative   Not on file   Social Drivers of Health   Financial Resource Strain: Low Risk  (01/18/2023)   Overall Financial Resource Strain (CARDIA)    Difficulty of Paying Living Expenses: Not hard at all  Food Insecurity: No Food Insecurity (01/18/2023)   Hunger Vital Sign    Worried About Running Out of Food in the Last Year: Never true    Ran Out of Food in the Last Year: Never true  Transportation Needs: No Transportation Needs (01/18/2023)   PRAPARE - Administrator, Civil Service (Medical): No    Lack of Transportation (Non-Medical): No  Physical Activity: Unknown (01/18/2023)   Exercise Vital Sign    Days of Exercise per Week: Patient declined    Minutes of Exercise per Session: 20 min  Stress: No Stress Concern Present (01/18/2023)   [employer] of Occupational Health - Occupational Stress Questionnaire  Feeling of Stress : Not at all  Social Connections: Moderately Isolated (01/18/2023)   Social Connection and Isolation Panel [NHANES]    Frequency of Communication with Friends and Family: More than three times a week    Frequency of Social Gatherings with Friends and Family: Once a week    Attends Religious Services: Never    Database Administrator or Organizations: No    Attends Engineer, Structural: Not on file    Marital Status: Married     Family History: The patient's family history includes Depression in his paternal grandmother; Heart attack (age of onset: 34 - 4) in his maternal grandfather; Heart attack (age of onset: 55 - 44) in his paternal grandmother; Heart attack  (age of onset: 45) in his paternal grandfather.  ROS:   Please see the history of present illness.     All other systems reviewed and are negative.  EKGs/Labs/Other Studies Reviewed:    The following studies were reviewed today:   EKG:   03/19/2023: Sinus bradycardia, rate 59, no ST abnormalities  Recent Labs: 02/04/2023: ALT 28; BUN 16; Creatinine, Ser 1.01; Hemoglobin 14.2; Platelets 319.0; Potassium 4.3; Sodium 137; TSH 5.25  Recent Lipid Panel    Component Value Date/Time   CHOL 199 02/04/2023 0919   TRIG 313.0 (H) 02/04/2023 0919   HDL 37.20 (L) 02/04/2023 0919   CHOLHDL 5 02/04/2023 0919   VLDL 62.6 (H) 02/04/2023 0919   LDLCALC 99 02/04/2023 0919   LDLCALC 124 (H) 04/15/2019 1440   LDLDIRECT 138.0 09/13/2021 0929    Physical Exam:    VS:  BP (!) 144/90   Pulse 65   Ht 6' (1.829 m)   Wt 291 lb 12.8 oz (132.4 kg)   SpO2 96%   BMI 39.58 kg/m     Wt Readings from Last 3 Encounters:  03/19/23 291 lb 12.8 oz (132.4 kg)  02/04/23 283 lb 6.4 oz (128.5 kg)  01/18/23 284 lb (128.8 kg)     GEN:  Well nourished, well developed in no acute distress HEENT: Normal NECK: No JVD; No carotid bruits LYMPHATICS: No lymphadenopathy CARDIAC: RRR, no murmurs, rubs, gallops RESPIRATORY:  Clear to auscultation without rales, wheezing or rhonchi  ABDOMEN: Soft, non-tender, non-distended MUSCULOSKELETAL:  No edema; No deformity  SKIN: Warm and dry NEUROLOGIC:  Alert and oriented x 3 PSYCHIATRIC:  Normal affect   ASSESSMENT:    1. Chest pain of uncertain etiology   2. Primary hypertension   3. Hyperlipidemia, unspecified hyperlipidemia type   4. Therapeutic drug monitoring    PLAN:    Chest pain: Description concerning for angina, as describes exertional chest tightness. -Recommend coronary CTA to evaluate for obstructive CAD.  Low resting heart rate, will not give Lopressor prior to study -Echocardiogram to evaluate for structural heart disease  Hypertension: On  telmisartan  80 mg daily and HCTZ 25 mg daily.  BP elevated in clinic today but reports under good control at home.  Asked to check BP twice daily for next week and let us  know results  T2DM: A1c 7.3% on 02/04/2023.  New diagnosis, Mounjaro  was prescribed but he has not started to take  Hyperlipidemia: LDL 99 on 02/04/2023.  Given diagnosis of diabetes, meets indication for starting statin.  Recommend starting atorvastatin  20 mg daily  Obesity:Body mass index is 39.58 kg/m.  Starting Mounjaro  as above  RTC in 3 months  Medication Adjustments/Labs and Tests Ordered: Current medicines are reviewed at length with the patient today.  Concerns regarding medicines are outlined above.  Orders Placed This Encounter  Procedures   CT CORONARY MORPH W/CTA COR W/SCORE W/CA W/CM &/OR WO/CM   Basic metabolic panel   Lipid panel   EKG 12-Lead   ECHOCARDIOGRAM COMPLETE   Meds ordered this encounter  Medications   atorvastatin  (LIPITOR) 20 MG tablet    Sig: Take 1 tablet (20 mg total) by mouth daily.    Dispense:  90 tablet    Refill:  3    Patient Instructions  Medication Instructions:  START ATORVASTATIN  20 MG DAILY   *If you need a refill on your cardiac medications before your next appointment, please call your pharmacy*  Lab Work: BMET ABOUT A WEEK PRIOR TO CARDIAC CT  FASTING LIPID IN 3 MONTHS   If you have labs (blood work) drawn today and your tests are completely normal, you will receive your results only by: MyChart Message (if you have MyChart) OR A paper copy in the mail If you have any lab test that is abnormal or we need to change your treatment, we will call you to review the results.  Testing/Procedures: Your physician has requested that you have an echocardiogram. Echocardiography is a painless test that uses sound waves to create images of your heart. It provides your doctor with information about the size and shape of your heart and how well your heart's chambers and  valves are working. This procedure takes approximately one hour. There are no restrictions for this procedure. Please do NOT wear cologne, perfume, aftershave, or lotions (deodorant is allowed). Please arrive 15 minutes prior to your appointment time.  Please note: We ask at that you not bring children with you during ultrasound (echo/ vascular) testing. Due to room size and safety concerns, children are not allowed in the ultrasound rooms during exams. Our front office staff cannot provide observation of children in our lobby area while testing is being conducted. An adult accompanying a patient to their appointment will only be allowed in the ultrasound room at the discretion of the ultrasound technician under special circumstances. We apologize for any inconvenience.   Your physician has requested that you have cardiac CT. Cardiac computed tomography (CT) is a painless test that uses an x-ray machine to take clear, detailed pictures of your heart. For further information please visit [email or web address] Please follow instruction sheet as given.  Follow-Up: At [facility], you and your health needs are our priority.  As part of our continuing mission to provide you with exceptional heart care, we have created designated Provider Care Teams.  These Care Teams include your primary Cardiologist (physician) and Advanced Practice Providers (APPs -  Physician Assistants and Nurse Practitioners) who all work together to provide you with the care you need, when you need it.  We recommend signing up for the patient portal called MyChart.  Sign up information is provided on this After Visit Summary.  MyChart is used to connect with patients for Virtual Visits (Telemedicine).  Patients are able to view lab/test results, encounter notes, upcoming appointments, etc.  Non-urgent messages can be sent to your provider as well.   To learn more about what you can do with MyChart, go to  [email or web address].    Your next appointment:   4 month(s)  Provider:   Lonni Nanas, MD    Other Instructions MONITOR AND LOG YOUR BLOOD PRESSURE TWICE A DAY FOR 1 WEEK. CALL OR SEND MYCHART WITH THE READINGS   Your cardiac CT will be  scheduled at one of the below locations:   [facility] [address] [place], [place] [address] [phone]  OR  [facility] [address] [place], [place] [address] [phone]  OR   [facility] [address] [place], [place] [address] [phone]  OR   [facility] [address] [place], [place] [address] [phone]  If scheduled at [facility], please arrive at the [facility] and Children's Entrance (Entrance C2) of [facility] 30 minutes prior to test start time. You can use the FREE valet parking offered at entrance C (encouraged to control the heart rate for the test)  Proceed to the [facility] Radiology Department (first floor) to check-in and test prep.  All radiology patients and guests should use entrance C2 at [facility], accessed from [facility], even though the hospital's physical address listed is [address].    If scheduled at [facility] or [facility], please arrive 15 mins early for check-in and test prep.  There is spacious parking and easy access to the radiology department from the [facility] Heart and Vascular entrance. Please enter here and check-in with the desk attendant.   If scheduled at [facility], please arrive 30 minutes early for check-in and test prep.  Please follow these instructions carefully (unless otherwise directed):  An IV will be required for this test and Nitroglycerin  will be given.  Hold all erectile dysfunction medications at least 3 days (72 hrs) prior to test. (Ie  viagra, cialis, sildenafil, tadalafil, etc)   On the Night Before the Test: Be sure to Drink plenty of water. Do not consume any caffeinated/decaffeinated beverages or chocolate 12 hours prior to your test. Do not take any antihistamines 12 hours prior to your test.  On the Day of the Test: Drink plenty of water until 1 hour prior to the test. Do not eat any food 1 hour prior to test. You may take your regular medications prior to the test.  Take metoprolol (Lopressor) two hours prior to test. If you take Furosemide/Hydrochlorothiazide /Spironolactone/Chlorthalidone, please HOLD on the morning of the test. Patients who wear a continuous glucose monitor MUST remove the device prior to scanning.  After the Test: Drink plenty of water. After receiving IV contrast, you may experience a mild flushed feeling. This is normal. On occasion, you may experience a mild rash up to 24 hours after the test. This is not dangerous. If this occurs, you can take Benadryl 25 mg, Zyrtec, Claritin, or Allegra and increase your fluid intake. (Patients taking Tikosyn should avoid Benadryl, and may take Zyrtec, Claritin, or Allegra) If you experience trouble breathing, this can be serious. If it is severe call 911 IMMEDIATELY. If it is mild, please call our office.  We will call to schedule your test 2-4 weeks out understanding that some insurance companies will need an authorization prior to the service being performed.   For more information and frequently asked questions, please visit our website : [email or web address]  For non-scheduling related questions, please contact the cardiac imaging nurse navigator should you have any questions/concerns: Cardiac Imaging Nurse Navigators Direct Office Dial: [phone]   For scheduling needs, including cancellations and rescheduling, please call Brittany, [phone].   Cardiac CT Angiogram A cardiac CT angiogram is a procedure to look at the heart  and the area around the heart. It may be done to  help find the cause of chest pains or other symptoms of heart disease. During this procedure, a substance called contrast dye is injected into a vein in the arm. The contrast highlights the blood vessels in the area to be checked. A large X-ray machine (CT scanner), then takes detailed pictures of the heart and the surrounding area. The procedure is also sometimes called a coronary CT angiogram, coronary artery scanning, or CTA. A cardiac CT angiogram allows the health care provider to see how well blood is flowing to and from the heart. The provider will be able to see if there are any problems, such as: Blockage or narrowing of the arteries in the heart. Fluid around the heart. Signs of weakness or disease in the muscles, valves, and tissues of the heart. Tell a health care provider about: Any allergies you have. This is especially important if you have had a previous allergic reaction to medicines, contrast dye, or iodine. All medicines you are taking, including vitamins, herbs, eye drops, creams, and over-the-counter medicines. Any bleeding problems you have. Any surgeries you have had. Any medical conditions you have, including kidney problems or kidney failure. Whether you are pregnant or may be pregnant. Any anxiety disorders, chronic pain, or other conditions you have. These may increase your stress or prevent you from lying still. Any history of abnormal heart rhythms or heart procedures. What are the risks? Your provider will talk with you about risks. These may include: Bleeding. Infection. Allergic reactions to medicines or dyes. Damage to other structures or organs. Kidney damage from the contrast dye. Increased risk of cancer from radiation exposure. This risk is low. Talk with your provider about: The risks and benefits of testing. How you can receive the lowest dose of radiation. What happens before the procedure? Wear  comfortable clothing and remove any jewelry, glasses, dentures, and hearing aids. Follow instructions from your provider about eating and drinking. These may include: 12 hours before the procedure Avoid caffeine. This includes tea, coffee, soda, energy drinks, and diet pills. Drink plenty of water or other fluids that do not have caffeine in them. Being well hydrated can prevent complications. 4-6 hours before the procedure Stop eating and drinking. This will reduce the risk of nausea from the contrast dye. Ask your provider about changing or stopping your regular medicines. These include: Diabetes medicines. Medicines to treat problems with erections (erectile dysfunction). If you have kidney problems, you may need to receive IV hydration before and after the test. What happens during the procedure?  Hair on your chest may need to be removed so that small sticky patches called electrodes can be placed on your chest. These will transmit information that helps to monitor your heart during the procedure. An IV will be inserted into one of your veins. You might be given a medicine to control your heart rate during the procedure. This will help to ensure that good images are obtained. You will be asked to lie on an exam table. This table will slide in and out of the CT machine during the procedure. Contrast dye will be injected into the IV. You might feel warm, or you may get a metallic taste in your mouth. You may be given medicines to relax or dilate the arteries in your heart. If you are allergic to contrast dyes or iodine you may be given medicine before the test to reduce the risk of an allergic reaction. The table that you are lying on will move into the CT  machine tunnel for the scan. The person running the machine will give you instructions while the scans are being done. You may be asked to: Keep your arms above your head. Hold your breath for short periods. Stay very still, even if the  table is moving. The procedure may vary among providers and hospitals. What can I expect after the procedure? After your procedure, it is common to have: A metallic taste in your mouth from the contrast dye. A feeling of warmth. A headache from the heart medicine. Follow these instructions at home: Take over-the-counter and prescription medicines only as told by your provider. If you are told, drink enough fluid to keep your pee pale yellow. This will help to flush the contrast dye out of your body. Most people can return to their normal activities right after the procedure. Ask your provider what activities are safe for you. It is up to you to get the results of your procedure. Ask your provider, or the department that is doing the procedure, when your results will be ready. Contact a health care provider if: You have any symptoms of allergy to the contrast dye. These include: Shortness of breath. Rash or hives. A racing heartbeat. You notice a change in your peeing (urination). This information is not intended to replace advice given to you by your health care provider. Make sure you discuss any questions you have with your health care provider. Document Revised: 09/01/2021 Document Reviewed: 09/01/2021 Elsevier Patient Education  2024 Elsevier Inc.   Signed, Lonni LITTIE Nanas, MD  03/19/2023 5:17 PM     [facility]

## 2023-04-09 ENCOUNTER — Ambulatory Visit (HOSPITAL_BASED_OUTPATIENT_CLINIC_OR_DEPARTMENT_OTHER): Payer: 59

## 2023-04-09 DIAGNOSIS — R079 Chest pain, unspecified: Secondary | ICD-10-CM | POA: Diagnosis not present

## 2023-04-09 DIAGNOSIS — I10 Essential (primary) hypertension: Secondary | ICD-10-CM

## 2023-04-09 LAB — ECHOCARDIOGRAM COMPLETE
AR max vel: 1.37 cm2
AV Area VTI: 1.5 cm2
AV Area mean vel: 1.34 cm2
AV Mean grad: 7 mmHg
AV Peak grad: 13 mmHg
Ao pk vel: 1.8 m/s
Area-P 1/2: 2.83 cm2
S' Lateral: 2.87 cm

## 2023-04-09 MED ORDER — PERFLUTREN LIPID MICROSPHERE
1.0000 mL | INTRAVENOUS | Status: AC | PRN
  Administered 2023-04-09: 1 mL via INTRAVENOUS

## 2023-05-02 ENCOUNTER — Ambulatory Visit (HOSPITAL_COMMUNITY)

## 2023-07-01 ENCOUNTER — Telehealth: Payer: Self-pay | Admitting: Cardiology

## 2023-07-01 DIAGNOSIS — I10 Essential (primary) hypertension: Secondary | ICD-10-CM

## 2023-07-01 DIAGNOSIS — R079 Chest pain, unspecified: Secondary | ICD-10-CM

## 2023-07-01 NOTE — Telephone Encounter (Signed)
  Patient has sent a MyChart scheduling message following up on Dr Lavern Potash recommendation.

## 2023-07-01 NOTE — Telephone Encounter (Signed)
   Pt c/o of Chest Pain: STAT if active CP, including tightness, pressure, jaw pain, radiating pain to shoulder/upper arm/back, CP unrelieved by Nitro. Symptoms reported of SOB, nausea, vomiting, sweating.  1. Are you having CP right now? Tightness in chest at this time    2. Are you experiencing any other symptoms (ex. SOB, nausea, vomiting, sweating)? no   3. Is your CP continuous or coming and going? Comes and gone   4. Have you taken Nitroglycerin ? Yes- Saturday   5. How long have you been experiencing CP? Since Saturday    6. If NO CP at time of call then end call with telling Pt to call back or call 911 if Chest pain returns prior to return call from triage team.

## 2023-07-01 NOTE — Telephone Encounter (Signed)
 Follow Up:    Patient called and said he was waiting to hear something from this morning.See previous note for 07-01-23.Douglas Pena

## 2023-07-01 NOTE — Telephone Encounter (Signed)
 Coronary CTA was ordered at his initial clinic visit but does not look like this was scheduled, can we schedule this?

## 2023-07-01 NOTE — Telephone Encounter (Signed)
 Called patient back to let him know his message from earlier has been sent to Dr. Alda Amas, but if his symptoms come back please use the nitroglycerin  as prescribed. Informed him if nitroglycerin  does not help to call 911 or go to ED. Patient has appointment with Dr. Alda Amas on 07/15/23, there is nothing sooner available.

## 2023-07-01 NOTE — Telephone Encounter (Signed)
 Pt advised and says he is feeling much better... his pain has relieved... he will plan to go to the ED if his symptoms come back prior to his next OV.

## 2023-07-01 NOTE — Telephone Encounter (Signed)
 Spoke with pt over the phone who states he has been having chest tightness/ pressure since Saturday. Pt stated that it occurs usually at night and has been keeping him up at night unable to sleep. Pt stated he did take 1 nitroglycerin  tablet and that caused the pain to subside. Pt denies any other symptoms such as SOB, N/V, sweating. ED precautions given to pt while waiting for recommendations. Explained that I would forward this to Dr. Alda Amas and as soon as we were given further guidance, we would let him know. Pt verbalized understanding of plan.

## 2023-07-01 NOTE — Telephone Encounter (Signed)
 If he is having ongoing chest pain, should go to ED for evaluation

## 2023-07-03 NOTE — Addendum Note (Signed)
 Addended by: Roxianne Coral on: 07/03/2023 08:21 AM   Modules accepted: Orders

## 2023-07-15 ENCOUNTER — Ambulatory Visit: Payer: 59 | Admitting: Cardiology

## 2023-08-29 ENCOUNTER — Encounter (HOSPITAL_COMMUNITY): Payer: Self-pay

## 2023-09-21 ENCOUNTER — Other Ambulatory Visit: Payer: Self-pay | Admitting: Family Medicine

## 2023-09-21 DIAGNOSIS — I10 Essential (primary) hypertension: Secondary | ICD-10-CM

## 2023-09-23 ENCOUNTER — Other Ambulatory Visit: Payer: Self-pay | Admitting: *Deleted

## 2023-09-23 DIAGNOSIS — I10 Essential (primary) hypertension: Secondary | ICD-10-CM

## 2023-09-23 MED ORDER — TELMISARTAN 80 MG PO TABS
80.0000 mg | ORAL_TABLET | Freq: Every day | ORAL | 0 refills | Status: DC
Start: 2023-09-23 — End: 2023-10-20

## 2023-09-26 ENCOUNTER — Encounter (HOSPITAL_COMMUNITY): Payer: Self-pay

## 2023-10-19 ENCOUNTER — Other Ambulatory Visit: Payer: Self-pay | Admitting: Family Medicine

## 2023-10-19 DIAGNOSIS — I10 Essential (primary) hypertension: Secondary | ICD-10-CM

## 2023-10-20 NOTE — Telephone Encounter (Signed)
 Needs appt

## 2023-11-16 ENCOUNTER — Other Ambulatory Visit: Payer: Self-pay | Admitting: Family Medicine

## 2023-11-16 DIAGNOSIS — I10 Essential (primary) hypertension: Secondary | ICD-10-CM

## 2023-12-24 ENCOUNTER — Other Ambulatory Visit: Payer: Self-pay | Admitting: Family

## 2023-12-24 DIAGNOSIS — I10 Essential (primary) hypertension: Secondary | ICD-10-CM

## 2024-01-01 ENCOUNTER — Encounter: Payer: Self-pay | Admitting: Family Medicine

## 2024-01-01 ENCOUNTER — Other Ambulatory Visit: Payer: Self-pay | Admitting: Family Medicine

## 2024-01-01 DIAGNOSIS — I10 Essential (primary) hypertension: Secondary | ICD-10-CM

## 2024-01-01 MED ORDER — TELMISARTAN 80 MG PO TABS: 80.0000 mg | ORAL_TABLET | Freq: Every day | ORAL | 0 refills | Status: DC

## 2024-01-01 NOTE — Progress Notes (Signed)
 Needs appt

## 2024-01-31 ENCOUNTER — Encounter: Payer: Self-pay | Admitting: Family Medicine

## 2024-01-31 ENCOUNTER — Ambulatory Visit: Admitting: Family Medicine

## 2024-01-31 VITALS — BP 130/84 | HR 70 | Temp 97.0°F | Ht 72.0 in | Wt 268.4 lb

## 2024-01-31 DIAGNOSIS — I10 Essential (primary) hypertension: Secondary | ICD-10-CM | POA: Diagnosis not present

## 2024-01-31 DIAGNOSIS — N5201 Erectile dysfunction due to arterial insufficiency: Secondary | ICD-10-CM | POA: Diagnosis not present

## 2024-01-31 DIAGNOSIS — E119 Type 2 diabetes mellitus without complications: Secondary | ICD-10-CM | POA: Diagnosis not present

## 2024-01-31 DIAGNOSIS — Z125 Encounter for screening for malignant neoplasm of prostate: Secondary | ICD-10-CM

## 2024-01-31 LAB — CBC WITH DIFFERENTIAL/PLATELET
Basophils Absolute: 0 K/uL (ref 0.0–0.1)
Basophils Relative: 0.5 % (ref 0.0–3.0)
Eosinophils Absolute: 0 K/uL (ref 0.0–0.7)
Eosinophils Relative: 0.7 % (ref 0.0–5.0)
HCT: 41.9 % (ref 39.0–52.0)
Hemoglobin: 14.3 g/dL (ref 13.0–17.0)
Lymphocytes Relative: 23.8 % (ref 12.0–46.0)
Lymphs Abs: 1.7 K/uL (ref 0.7–4.0)
MCHC: 34.1 g/dL (ref 30.0–36.0)
MCV: 90.5 fl (ref 78.0–100.0)
Monocytes Absolute: 0.9 K/uL (ref 0.1–1.0)
Monocytes Relative: 12.3 % — ABNORMAL HIGH (ref 3.0–12.0)
Neutro Abs: 4.5 K/uL (ref 1.4–7.7)
Neutrophils Relative %: 62.7 % (ref 43.0–77.0)
Platelets: 243 K/uL (ref 150.0–400.0)
RBC: 4.63 Mil/uL (ref 4.22–5.81)
RDW: 13.8 % (ref 11.5–15.5)
WBC: 7.1 K/uL (ref 4.0–10.5)

## 2024-01-31 LAB — LIPID PANEL
Cholesterol: 171 mg/dL (ref 28–200)
HDL: 43.4 mg/dL
LDL Cholesterol: 99 mg/dL (ref 10–99)
NonHDL: 127.94
Total CHOL/HDL Ratio: 4
Triglycerides: 144 mg/dL (ref 10.0–149.0)
VLDL: 28.8 mg/dL (ref 0.0–40.0)

## 2024-01-31 LAB — T3, FREE: T3, Free: 4 pg/mL (ref 2.3–4.2)

## 2024-01-31 LAB — COMPREHENSIVE METABOLIC PANEL WITH GFR
ALT: 29 U/L (ref 3–53)
AST: 20 U/L (ref 5–37)
Albumin: 4.6 g/dL (ref 3.5–5.2)
Alkaline Phosphatase: 59 U/L (ref 39–117)
BUN: 17 mg/dL (ref 6–23)
CO2: 26 meq/L (ref 19–32)
Calcium: 9.6 mg/dL (ref 8.4–10.5)
Chloride: 97 meq/L (ref 96–112)
Creatinine, Ser: 1.16 mg/dL (ref 0.40–1.50)
GFR: 70.32 mL/min
Glucose, Bld: 92 mg/dL (ref 70–99)
Potassium: 4 meq/L (ref 3.5–5.1)
Sodium: 133 meq/L — ABNORMAL LOW (ref 135–145)
Total Bilirubin: 0.4 mg/dL (ref 0.2–1.2)
Total Protein: 7.6 g/dL (ref 6.0–8.3)

## 2024-01-31 LAB — MICROALBUMIN / CREATININE URINE RATIO
Creatinine,U: 33 mg/dL
Microalb Creat Ratio: UNDETERMINED mg/g (ref 0.0–30.0)
Microalb, Ur: <0.7 mg/dL

## 2024-01-31 LAB — PSA: PSA: 0.85 ng/mL (ref 0.10–4.00)

## 2024-01-31 LAB — TSH: TSH: 5.27 u[IU]/mL (ref 0.35–5.50)

## 2024-01-31 LAB — T4, FREE: Free T4: 0.8 ng/dL (ref 0.60–1.60)

## 2024-01-31 LAB — HEMOGLOBIN A1C: Hgb A1c MFr Bld: 6.2 % (ref 4.6–6.5)

## 2024-01-31 MED ORDER — TIRZEPATIDE 2.5 MG/0.5ML ~~LOC~~ SOAJ: 2.5000 mg | SUBCUTANEOUS | 0 refills | Status: DC

## 2024-01-31 MED ORDER — TELMISARTAN 80 MG PO TABS: 80.0000 mg | ORAL_TABLET | Freq: Every day | ORAL | 1 refills | Status: AC

## 2024-01-31 MED ORDER — TADALAFIL 20 MG PO TABS: 10.0000 mg | ORAL_TABLET | ORAL | 11 refills | Status: AC | PRN

## 2024-01-31 MED ORDER — HYDROCHLOROTHIAZIDE 25 MG PO TABS: 25.0000 mg | ORAL_TABLET | Freq: Every day | ORAL | 1 refills | Status: AC

## 2024-01-31 NOTE — Patient Instructions (Signed)

## 2024-01-31 NOTE — Progress Notes (Signed)
 "  Subjective:     Patient ID: Douglas Pena, male    DOB: 08-May-1965, 58 y.o.   MRN: [id number]  Chief Complaint  Patient presents with   Hypertension    Pt is here for chronic issues    Discussed the use of AI scribe software for clinical note transcription with the patient, who gave verbal consent to proceed.  History of Present Illness Douglas Pena is a 58 year old male with hypertension, hyperlipidemia, and diabetes who presents for follow-up and medication management.  He has experienced significant weight loss of almost thirty pounds over the past year without the use of Mounjaro , which he initially decided not to fill. However, he has reached a plateau in his weight loss.  He has been managing his diabetes by walking for thirty minutes daily until the weather became too cold, and he plans to use a treadmill at home. He was walking for thirty minutes daily until the weather became too cold and plans to use a treadmill at home. No excessive thirst, blurry vision, or frequent urination.  For hypertension, he is taking hydrochlorothiazide  25 mg and telmisartan  80 mg. His blood pressure readings are usually in the low 120s over low 80s, though they are higher during office visits.  He is not taking any cholesterol medication and has expressed reluctance to take statins.  He reports issues with erectile dysfunction, specifically difficulty maintaining an erection, though he has no trouble achieving one. His partner's increased libido has highlighted this issue. He has not used medications like Cialis or Viagra.  He mentions a past episode where he used nitroglycerin  twice due to high blood pressure, attributed to running out of his blood pressure medication and engaging in activities that elevated his blood pressure.  He has not completed a stress test due to cost concerns, despite having an echocardiogram which showed no issues.  Socially, he has reduced his alcohol consumption  and is attempting to cut back on sugar intake, though he finds it challenging during the holiday season. 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 He has a partner named Sari, who has recently started testosterone therapy.    Health Maintenance Due  Topic Date Due   FOOT EXAM  Never done   OPHTHALMOLOGY EXAM  Never done   Diabetic kidney evaluation - Urine ACR  Never done   HEMOGLOBIN A1C  08/05/2023   Diabetic kidney evaluation - eGFR measurement  02/04/2024    History reviewed. No pertinent past medical history.  Past Surgical History:  Procedure Laterality Date   ROTATOR CUFF REPAIR Bilateral     Current Medications[1]  Allergies[2] ROS neg/noncontributory except as noted HPI/below      Objective:     BP 130/84 (BP Location: Left Arm, Patient Position: Sitting, Cuff Size: Large)   Pulse 70   Temp (!) 97 F (36.1 C) (Temporal)   Ht 6' (1.829 m)   Wt 268 lb 6 oz (121.7 kg)   SpO2 98%   BMI 36.40 kg/m  Wt Readings from Last 3 Encounters:  01/31/24 268 lb 6 oz (121.7 kg)  03/19/23 291 lb 12.8 oz (132.4 kg)  02/04/23 283 lb 6.4 oz (128.5 kg)    Physical Exam GENERAL: Well developed, well nourished, no acute distress, no significant swelling. HEAD EYES EARS NOSE THROAT: Normocephalic, atraumatic, conjunctiva not injected, sclera nonicteric. CARDIAC: Regular rate and rhythm, S1 S2 present, no murmur, dorsalis pedis  2 plus bilaterally. NECK: Supple, no thyromegaly, no nodes, no carotid bruits. LUNGS: Clear to auscultation bilaterally, no wheezes. ABDOMEN: Bowel sounds present, soft, non-tender, non-distended, no hepatosplenomegaly, no masses. EXTREMITIES: No edema. MUSCULOSKELETAL: No gross abnormalities. NEUROLOGICAL: Alert and oriented x3, cranial nerves II through XII intact. PSYCHIATRIC: Normal mood, good eye contact.       Assessment & Plan:  Primary hypertension -     Comprehensive  metabolic panel with GFR -     CBC with Differential/Platelet -     Microalbumin / creatinine urine ratio -     T3, free -     T4, free -     hydroCHLOROthiazide ; Take 1 tablet (25 mg total) by mouth daily.  Dispense: 90 tablet; Refill: 1 -     Telmisartan ; Take 1 tablet (80 mg total) by mouth daily.  Dispense: 90 tablet; Refill: 1  Type 2 diabetes mellitus without complication, without long-term current use of insulin (HCC) -     Lipid panel -     TSH -     Comprehensive metabolic panel with GFR -     Hemoglobin A1c -     Microalbumin / creatinine urine ratio -     T3, free -     T4, free -     Tirzepatide ; Inject 2.5 mg into the skin once a week.  Dispense: 2 mL; Refill: 0  Screening for prostate cancer -     PSA  Erectile dysfunction due to arterial insufficiency -     Tadalafil; Take 0.5-1 tablets (10-20 mg total) by mouth every other day as needed for erectile dysfunction.  Dispense: 10 tablet; Refill: 11    Assessment and Plan Assessment & Plan Type 2 diabetes mellitus   He has experienced a recent weight loss of nearly 30 pounds but has now plateaued. He is considering resuming Mounjaro . There are no symptoms of hyperglycemia such as excessive thirst, blurry vision, or polyuria. Blood pressure is well-controlled with current medications. He has not been taking atorvastatin  due to concerns about liver disease, despite being at high risk for cardiovascular disease due to diabetes. Restart Mounjaro  at 2.5 mg once weekly for 4 weeks, then increase to 5 mg if tolerated. Blood work has been ordered to assess current diabetes control. He is encouraged to continue weight loss efforts and exercise, including using a treadmill indoors. The importance of atorvastatin  for cardiovascular risk reduction due to diabetes was discussed.  Primary hypertension   His hypertension is well-controlled with hydrochlorothiazide  25 mg and telmisartan  80 mg. Blood pressure readings are typically in the  low 120s/low 80s. There are no symptoms of hypertension-related complications such as headache, dizziness, or chest pain. Continue hydrochlorothiazide  25 mg and telmisartan  80 mg daily. Prescriptions for hydrochlorothiazide  and telmisartan  have been refilled.  Erectile dysfunction due to arterial insufficiency   He experiences erectile dysfunction characterized by difficulty maintaining an erection, likely due to arterial insufficiency related to hypertension and diabetes. There has been no prior use of PDE5 inhibitors. The potential benefits and side effects of Cialis (tadalafil) and Viagra (sildenafil) were discussed, including headache, dizziness, and rare visual disturbances. The need for sexual stimulation for efficacy was emphasized. He was advised against concurrent use of nitroglycerin  with Cialis due to the risk of severe hypotension. Cialis (tadalafil) 20 mg was prescribed, with instructions to start with 10 mg by breaking the tablet in half. He was educated on the use of Cialis, including timing and potential side effects.  Hyperlipidemia  Management is complicated by his preference to avoid atorvastatin  due to concerns about liver disease. He is at high cardiovascular risk due to diabetes and previous chest pain episodes. The importance of statin therapy in reducing cardiovascular risk, especially given his diabetes, was discussed. He is encouraged to discuss the necessity of statin therapy with his cardiologist. The potential benefits of atorvastatin  in reducing cardiovascular risk were discussed.     Return in about 3 months (around 04/30/2024) for chronic follow-up.  Jenkins CHRISTELLA Carrel, MD     [1]  Current Outpatient Medications:    nitroGLYCERIN  (NITROSTAT ) 0.4 MG SL tablet, Place 1 tablet (0.4 mg total) under the tongue every 5 (five) minutes as needed for chest pain., Disp: 50 tablet, Rfl: 0   tadalafil  (CIALIS ) 20 MG tablet, Take 0.5-1 tablets (10-20 mg total) by mouth every other day  as needed for erectile dysfunction., Disp: 10 tablet, Rfl: 11   hydrochlorothiazide  (HYDRODIURIL ) 25 MG tablet, Take 1 tablet (25 mg total) by mouth daily., Disp: 90 tablet, Rfl: 1   telmisartan  (MICARDIS ) 80 MG tablet, Take 1 tablet (80 mg total) by mouth daily., Disp: 90 tablet, Rfl: 1   tirzepatide  (MOUNJARO ) 2.5 MG/0.5ML Pen, Inject 2.5 mg into the skin once a week., Disp: 2 mL, Rfl: 0 [2] No Known Allergies  "

## 2024-02-01 ENCOUNTER — Ambulatory Visit
Admission: EM | Admit: 2024-02-01 | Discharge: 2024-02-01 | Disposition: A | Discharge status: Home / Self Care | Attending: Emergency Medicine | Admitting: Emergency Medicine

## 2024-02-01 DIAGNOSIS — B349 Viral infection, unspecified: Secondary | ICD-10-CM

## 2024-02-01 LAB — POC COVID19/FLU A&B COMBO
Covid Antigen, POC: NEGATIVE
Influenza A Antigen, POC: NEGATIVE
Influenza B Antigen, POC: NEGATIVE

## 2024-02-01 LAB — POCT RAPID STREP A (OFFICE): Rapid Strep A Screen: NEGATIVE

## 2024-02-01 MED ORDER — LIDOCAINE VISCOUS HCL 2 % MT SOLN: 15.0000 mL | OROMUCOSAL | 0 refills | Status: AC | PRN

## 2024-02-01 NOTE — Discharge Instructions (Addendum)
 Your symptoms today are most likely being caused by a virus and should steadily improve in time it can take up to 7 to 10 days before you truly start to see a turnaround however things will get better  COVID flu and strep testing negative  May gargle and spit lidocaine  solution every 4 hours as needed for temporary relief     You can take Tylenol and/or Ibuprofen as needed for fever reduction and pain relief.   For cough: honey 1/2 to 1 teaspoon (you can dilute the honey in water or another fluid).  You can also use guaifenesin and dextromethorphan for cough. You can use a humidifier for chest congestion and cough.  If you don't have a humidifier, you can sit in the bathroom with the hot shower running.      For sore throat: try warm salt water gargles, cepacol lozenges, throat spray, warm tea or water with lemon/honey, popsicles or ice, or OTC cold relief medicine for throat discomfort.   For congestion: take a daily anti-histamine like Zyrtec, Claritin, and a oral decongestant, such as pseudoephedrine.  You can also use Flonase 1-2 sprays in each nostril daily.   It is important to stay hydrated: drink plenty of fluids (water, gatorade/powerade/pedialyte, juices, or teas) to keep your throat moisturized and help further relieve irritation/discomfort.

## 2024-02-01 NOTE — ED Provider Notes (Signed)
 " Douglas Pena    CSN: [id number] Arrival date & time: 02/01/24  1310      History   Chief Complaint No chief complaint on file.   HPI Douglas Pena is a 58 y.o. male.   Patient presents for evaluation of nasal congestion, sore throat, nonproductive cough and intermittent headaches beginning 2 days ago.  No known sick contacts.  Has attempted use of TheraFlu and throat lozenges.  Experiencing pain with swallowing and endorses feels similar to when he had strep.  Denies fever.  No past medical history on file.  Patient Active Problem List   Diagnosis Date Noted   Type 2 diabetes mellitus without complication, without long-term current use of insulin (HCC) 02/04/2023   Prediabetes 08/08/2022   Primary hypertension 10/18/2021    Past Surgical History:  Procedure Laterality Date   ROTATOR CUFF REPAIR Bilateral        Home Medications    Prior to Admission medications  Medication Sig Start Date End Date Taking? Authorizing Provider  hydrochlorothiazide  (HYDRODIURIL ) 25 MG tablet Take 1 tablet (25 mg total) by mouth daily. 01/31/24   Wendolyn Jenkins Jansky, MD  nitroGLYCERIN  (NITROSTAT ) 0.4 MG SL tablet Place 1 tablet (0.4 mg total) under the tongue every 5 (five) minutes as needed for chest pain. 01/18/23   Wendolyn Jenkins Jansky, MD  tadalafil  (CIALIS ) 20 MG tablet Take 0.5-1 tablets (10-20 mg total) by mouth every other day as needed for erectile dysfunction. 01/31/24   Wendolyn Jenkins Jansky, MD  telmisartan  (MICARDIS ) 80 MG tablet Take 1 tablet (80 mg total) by mouth daily. 01/31/24   Wendolyn Jenkins Jansky, MD  tirzepatide  (MOUNJARO ) 2.5 MG/0.5ML Pen Inject 2.5 mg into the skin once a week. 01/31/24   Wendolyn Jenkins Jansky, MD    Family History Family History  Problem Relation Age of Onset   Heart attack Maternal Grandfather 57 - 39   Depression Paternal Grandmother    Heart attack Paternal Grandmother 72 - 62   Heart attack Paternal Grandfather 17    Social History Social  History[1]   Allergies   Patient has no known allergies.   Review of Systems Review of Systems  Constitutional: Negative.   HENT:  Positive for congestion and sore throat. Negative for dental problem, drooling, ear discharge, ear pain, facial swelling, hearing loss, mouth sores, nosebleeds, postnasal drip, rhinorrhea, sinus pressure, sinus pain, sneezing, tinnitus, trouble swallowing and voice change.   Respiratory:  Positive for cough. Negative for apnea, choking, chest tightness, shortness of breath, wheezing and stridor.   Gastrointestinal: Negative.   Neurological:  Positive for headaches. Negative for dizziness, tremors, seizures, syncope, facial asymmetry, speech difficulty, weakness, light-headedness and numbness.     Physical Exam Triage Vital Signs ED Triage Vitals  Encounter Vitals Group     BP      Girls Systolic BP Percentile      Girls Diastolic BP Percentile      Boys Systolic BP Percentile      Boys Diastolic BP Percentile      Pulse      Resp      Temp      Temp src      SpO2      Weight      Height      Head Circumference      Peak Flow      Pain Score      Pain Loc      Pain Education  Exclude from Growth Chart    No data found.  Updated Vital Signs There were no vitals taken for this visit.  Visual Acuity Right Eye Distance:   Left Eye Distance:   Bilateral Distance:    Right Eye Near:   Left Eye Near:    Bilateral Near:     Physical Exam Constitutional:      Appearance: Normal appearance.  HENT:     Head: Normocephalic.     Right Ear: Tympanic membrane, ear canal and external ear normal.     Left Ear: Tympanic membrane, ear canal and external ear normal.     Nose: Congestion present.     Mouth/Throat:     Pharynx: Oropharynx is clear. No oropharyngeal exudate or posterior oropharyngeal erythema.  Eyes:     Extraocular Movements: Extraocular movements intact.  Cardiovascular:     Rate and Rhythm: Normal rate and regular rhythm.      Pulses: Normal pulses.     Heart sounds: Normal heart sounds.  Pulmonary:     Effort: Pulmonary effort is normal.     Breath sounds: Normal breath sounds.  Neurological:     Mental Status: He is alert and oriented to person, place, and time. Mental status is at baseline.      UC Treatments / Results  Labs (all labs ordered are listed, but only abnormal results are displayed) Labs Reviewed  POC COVID19/FLU A&B COMBO    EKG   Radiology No results found.  Procedures Procedures (including critical care time)  Medications Ordered in UC Medications - No data to display  Initial Impression / Assessment and Plan / UC Course  I have reviewed the triage vital signs and the nursing notes.  Pertinent labs & imaging results that were available during my care of the patient were reviewed by me and considered in my medical decision making (see chart for details).  Viral illness  Patient is in no signs of distress nor toxic appearing.  Vital signs are stable.  Low suspicion for pneumonia, pneumothorax or bronchitis and therefore will defer imaging.  COVID flu and strep test negative.  Prescribed viscous lidocaine .  May use additional over-the-counter medications as needed for supportive care.  May follow-up with urgent care as needed if symptoms persist or worsen.   Final Clinical Impressions(s) / UC Diagnoses   Final diagnoses:  Viral illness   Discharge Instructions   None    ED Prescriptions   None    PDMP not reviewed this encounter.     [1]  Social History Tobacco Use   Smoking status: Never   Smokeless tobacco: Never  Vaping Use   Vaping status: Never Used  Substance Use Topics   Alcohol use: Yes    Alcohol/week: 4.0 standard drinks of alcohol    Types: 2 Glasses of wine, 2 Cans of beer per week    Comment: occasionally   Drug use: Never     Teresa Shelba SAUNDERS, NP 02/01/24 1540  "

## 2024-02-01 NOTE — ED Notes (Signed)
 Patient triage by  Teresa Shelba SAUNDERS, NP

## 2024-02-02 ENCOUNTER — Ambulatory Visit: Payer: Self-pay | Admitting: Family Medicine

## 2024-02-02 NOTE — Progress Notes (Signed)
 Labs better.   Sodium a little low-which can be from hydrochlorothiazide , drinking too many liquids, or other.  If symptoms of dizziness/confusion, needs to be eval.  O/w repeat bmp 1-2 weeks for hyponatremia

## 2024-02-03 ENCOUNTER — Other Ambulatory Visit: Payer: Self-pay

## 2024-02-03 DIAGNOSIS — E871 Hypo-osmolality and hyponatremia: Secondary | ICD-10-CM

## 2024-02-03 NOTE — Progress Notes (Signed)
 Called pt notified and also put in Future order

## 2024-02-04 NOTE — Telephone Encounter (Signed)
 Noted

## 2024-02-11 DIAGNOSIS — E871 Hypo-osmolality and hyponatremia: Secondary | ICD-10-CM | POA: Diagnosis not present

## 2024-02-12 ENCOUNTER — Ambulatory Visit: Payer: Self-pay | Admitting: Family Medicine

## 2024-02-12 LAB — BASIC METABOLIC PANEL WITH GFR
BUN: 14 mg/dL (ref 7–25)
CO2: 24 mmol/L (ref 20–32)
Calcium: 9.2 mg/dL (ref 8.6–10.3)
Chloride: 100 mmol/L (ref 98–110)
Creat: 1.05 mg/dL (ref 0.70–1.30)
Glucose, Bld: 154 mg/dL — ABNORMAL HIGH (ref 65–99)
Potassium: 3.8 mmol/L (ref 3.5–5.3)
Sodium: 136 mmol/L (ref 135–146)
eGFR: 83 mL/min/1.73m2

## 2024-02-12 NOTE — Progress Notes (Signed)
 Ok except sugar-depends on when ate last

## 2024-02-12 NOTE — Progress Notes (Signed)
 Patient read lab results notes via MyChart.   Last read by Charlie BROCKS Studnicka at 8:16AM on 02/12/2024.

## 2024-02-17 ENCOUNTER — Encounter: Payer: Self-pay | Admitting: Family Medicine

## 2024-02-17 ENCOUNTER — Other Ambulatory Visit: Payer: Self-pay | Admitting: Family Medicine

## 2024-02-17 MED ORDER — TIRZEPATIDE 5 MG/0.5ML ~~LOC~~ SOAJ: 5.0000 mg | SUBCUTANEOUS | 0 refills | Status: DC

## 2024-02-19 ENCOUNTER — Other Ambulatory Visit: Payer: Self-pay | Admitting: Family Medicine

## 2024-02-19 ENCOUNTER — Other Ambulatory Visit (HOSPITAL_COMMUNITY): Payer: Self-pay

## 2024-02-19 ENCOUNTER — Telehealth: Payer: Self-pay

## 2024-02-19 NOTE — Telephone Encounter (Signed)
 Pharmacy Patient Advocate Encounter  Received notification from [facility] that Prior Authorization for Mounjaro  5MG /0.5ML auto-injectors  has been APPROVED from 02/19/24 to 02/19/27   PA #/Case ID/Reference #: [id number]

## 2024-02-19 NOTE — Telephone Encounter (Signed)
 Pharmacy Patient Advocate Encounter   Received notification from Physician's Office that prior authorization for Mounjaro  5MG /0.5ML auto-injectors is required/requested.   Insurance verification completed.   The patient is insured through [facility].   Per test claim: PA required; PA submitted to above mentioned insurance via Latent Key/confirmation #/EOC [id number] Status is pending

## 2024-02-21 ENCOUNTER — Other Ambulatory Visit: Payer: Self-pay | Admitting: Family Medicine

## 2024-05-08 ENCOUNTER — Ambulatory Visit: Admitting: Family Medicine
